# Patient Record
Sex: Female | Born: 1970 | Race: Black or African American | Hispanic: No | Marital: Single | Smoking: Never smoker
Health system: Southern US, Community
[De-identification: ages and names within clinical notes are randomized; demographics above are authoritative.]

## PROBLEM LIST (undated history)

## (undated) DIAGNOSIS — M51369 Other intervertebral disc degeneration, lumbar region without mention of lumbar back pain or lower extremity pain: Secondary | ICD-10-CM

## (undated) DIAGNOSIS — M545 Low back pain, unspecified: Secondary | ICD-10-CM

## (undated) DIAGNOSIS — F329 Major depressive disorder, single episode, unspecified: Secondary | ICD-10-CM

## (undated) DIAGNOSIS — E538 Deficiency of other specified B group vitamins: Secondary | ICD-10-CM

## (undated) DIAGNOSIS — G43909 Migraine, unspecified, not intractable, without status migrainosus: Secondary | ICD-10-CM

## (undated) DIAGNOSIS — M2550 Pain in unspecified joint: Secondary | ICD-10-CM

## (undated) DIAGNOSIS — M5136 Other intervertebral disc degeneration, lumbar region: Secondary | ICD-10-CM

## (undated) DIAGNOSIS — F419 Anxiety disorder, unspecified: Secondary | ICD-10-CM

## (undated) DIAGNOSIS — E559 Vitamin D deficiency, unspecified: Secondary | ICD-10-CM

## (undated) DIAGNOSIS — F32A Depression, unspecified: Secondary | ICD-10-CM

## (undated) DIAGNOSIS — I10 Essential (primary) hypertension: Secondary | ICD-10-CM

## (undated) DIAGNOSIS — J309 Allergic rhinitis, unspecified: Secondary | ICD-10-CM

## (undated) HISTORY — DX: Low back pain, unspecified: M54.50

## (undated) HISTORY — DX: Vitamin D deficiency, unspecified: E55.9

## (undated) HISTORY — DX: Depression, unspecified: F32.A

## (undated) HISTORY — DX: Deficiency of other specified B group vitamins: E53.8

## (undated) HISTORY — DX: Migraine, unspecified, not intractable, without status migrainosus: G43.909

## (undated) HISTORY — DX: Allergic rhinitis, unspecified: J30.9

## (undated) HISTORY — PX: ENDOMETRIAL ABLATION: SHX621

## (undated) HISTORY — DX: Morbid (severe) obesity due to excess calories: E66.01

## (undated) HISTORY — DX: Anxiety disorder, unspecified: F41.9

## (undated) HISTORY — DX: Pain in unspecified joint: M25.50

---
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
## 1996-01-19 HISTORY — PX: KNEE SURGERY: SHX244

## 1997-01-18 HISTORY — PX: TUBAL LIGATION: SHX77

## 2002-02-01 ENCOUNTER — Other Ambulatory Visit: Admission: RE | Admit: 2002-02-01 | Discharge: 2002-02-01 | Payer: Self-pay | Admitting: Family Medicine

## 2003-02-14 ENCOUNTER — Ambulatory Visit (HOSPITAL_COMMUNITY): Admission: RE | Admit: 2003-02-14 | Discharge: 2003-02-14 | Payer: Self-pay | Admitting: Internal Medicine

## 2003-02-19 ENCOUNTER — Encounter (INDEPENDENT_AMBULATORY_CARE_PROVIDER_SITE_OTHER): Payer: Self-pay | Admitting: Internal Medicine

## 2003-02-19 LAB — CONVERTED CEMR LAB

## 2003-04-03 ENCOUNTER — Encounter: Admission: RE | Admit: 2003-04-03 | Discharge: 2003-07-02 | Payer: Self-pay | Admitting: Internal Medicine

## 2003-06-24 ENCOUNTER — Ambulatory Visit (HOSPITAL_COMMUNITY): Admission: RE | Admit: 2003-06-24 | Discharge: 2003-06-24 | Payer: Self-pay | Admitting: Orthopedic Surgery

## 2003-09-19 ENCOUNTER — Ambulatory Visit: Payer: Self-pay | Admitting: Internal Medicine

## 2004-01-17 ENCOUNTER — Ambulatory Visit: Payer: Self-pay | Admitting: Internal Medicine

## 2004-03-09 ENCOUNTER — Ambulatory Visit: Payer: Self-pay | Admitting: Internal Medicine

## 2004-03-11 ENCOUNTER — Encounter: Admission: RE | Admit: 2004-03-11 | Discharge: 2004-03-11 | Payer: Self-pay | Admitting: Internal Medicine

## 2004-03-16 ENCOUNTER — Ambulatory Visit: Payer: Self-pay | Admitting: Internal Medicine

## 2004-04-06 ENCOUNTER — Ambulatory Visit: Payer: Self-pay | Admitting: Physical Medicine & Rehabilitation

## 2004-04-15 ENCOUNTER — Ambulatory Visit: Payer: Self-pay | Admitting: Internal Medicine

## 2004-05-09 ENCOUNTER — Emergency Department (HOSPITAL_COMMUNITY): Admission: EM | Admit: 2004-05-09 | Discharge: 2004-05-09 | Payer: Self-pay | Admitting: Emergency Medicine

## 2004-05-19 ENCOUNTER — Ambulatory Visit: Payer: Self-pay | Admitting: Physical Medicine & Rehabilitation

## 2004-06-26 ENCOUNTER — Encounter
Admission: RE | Admit: 2004-06-26 | Discharge: 2004-09-24 | Payer: Self-pay | Admitting: Physical Medicine & Rehabilitation

## 2004-06-29 ENCOUNTER — Emergency Department (HOSPITAL_COMMUNITY): Admission: EM | Admit: 2004-06-29 | Discharge: 2004-06-29 | Payer: Self-pay | Admitting: Emergency Medicine

## 2004-09-08 ENCOUNTER — Ambulatory Visit: Payer: Self-pay | Admitting: Physical Medicine & Rehabilitation

## 2004-10-15 ENCOUNTER — Encounter
Admission: RE | Admit: 2004-10-15 | Discharge: 2005-01-13 | Payer: Self-pay | Admitting: Physical Medicine & Rehabilitation

## 2004-11-12 ENCOUNTER — Ambulatory Visit: Payer: Self-pay | Admitting: Internal Medicine

## 2004-11-17 ENCOUNTER — Ambulatory Visit: Payer: Self-pay | Admitting: Physical Medicine & Rehabilitation

## 2004-12-08 ENCOUNTER — Encounter
Admission: RE | Admit: 2004-12-08 | Discharge: 2005-03-08 | Payer: Self-pay | Admitting: Physical Medicine & Rehabilitation

## 2004-12-21 ENCOUNTER — Ambulatory Visit: Payer: Self-pay | Admitting: Physical Medicine & Rehabilitation

## 2005-01-25 ENCOUNTER — Ambulatory Visit: Payer: Self-pay | Admitting: Internal Medicine

## 2005-02-16 ENCOUNTER — Ambulatory Visit: Payer: Self-pay | Admitting: Physical Medicine & Rehabilitation

## 2005-03-25 ENCOUNTER — Ambulatory Visit: Payer: Self-pay | Admitting: Internal Medicine

## 2005-06-08 ENCOUNTER — Encounter
Admission: RE | Admit: 2005-06-08 | Discharge: 2005-09-06 | Payer: Self-pay | Admitting: Physical Medicine & Rehabilitation

## 2005-06-08 ENCOUNTER — Ambulatory Visit: Payer: Self-pay | Admitting: Physical Medicine & Rehabilitation

## 2005-07-02 ENCOUNTER — Ambulatory Visit: Payer: Self-pay | Admitting: Physical Medicine & Rehabilitation

## 2005-08-16 ENCOUNTER — Emergency Department (HOSPITAL_COMMUNITY): Admission: EM | Admit: 2005-08-16 | Discharge: 2005-08-16 | Payer: Self-pay | Admitting: Family Medicine

## 2005-08-17 ENCOUNTER — Ambulatory Visit: Payer: Self-pay | Admitting: Internal Medicine

## 2005-10-26 ENCOUNTER — Ambulatory Visit: Payer: Self-pay | Admitting: Internal Medicine

## 2005-10-29 ENCOUNTER — Ambulatory Visit: Payer: Self-pay | Admitting: *Deleted

## 2005-12-31 ENCOUNTER — Ambulatory Visit: Payer: Self-pay | Admitting: Internal Medicine

## 2006-04-07 ENCOUNTER — Ambulatory Visit: Payer: Self-pay | Admitting: Internal Medicine

## 2006-06-16 ENCOUNTER — Ambulatory Visit: Payer: Self-pay | Admitting: Internal Medicine

## 2006-07-06 ENCOUNTER — Encounter (INDEPENDENT_AMBULATORY_CARE_PROVIDER_SITE_OTHER): Payer: Self-pay | Admitting: Internal Medicine

## 2006-07-06 DIAGNOSIS — IMO0002 Reserved for concepts with insufficient information to code with codable children: Secondary | ICD-10-CM | POA: Insufficient documentation

## 2006-07-06 DIAGNOSIS — I10 Essential (primary) hypertension: Secondary | ICD-10-CM | POA: Insufficient documentation

## 2006-07-06 DIAGNOSIS — M1990 Unspecified osteoarthritis, unspecified site: Secondary | ICD-10-CM | POA: Insufficient documentation

## 2006-07-06 DIAGNOSIS — M1710 Unilateral primary osteoarthritis, unspecified knee: Secondary | ICD-10-CM | POA: Insufficient documentation

## 2006-07-06 DIAGNOSIS — D6489 Other specified anemias: Secondary | ICD-10-CM | POA: Insufficient documentation

## 2006-09-14 ENCOUNTER — Ambulatory Visit: Payer: Self-pay | Admitting: Internal Medicine

## 2006-09-21 ENCOUNTER — Emergency Department (HOSPITAL_COMMUNITY): Admission: EM | Admit: 2006-09-21 | Discharge: 2006-09-21 | Payer: Self-pay | Admitting: Emergency Medicine

## 2006-10-05 ENCOUNTER — Encounter (INDEPENDENT_AMBULATORY_CARE_PROVIDER_SITE_OTHER): Payer: Self-pay | Admitting: *Deleted

## 2006-12-22 ENCOUNTER — Ambulatory Visit: Payer: Self-pay | Admitting: Internal Medicine

## 2007-01-25 ENCOUNTER — Ambulatory Visit: Payer: Self-pay | Admitting: Internal Medicine

## 2007-03-09 ENCOUNTER — Ambulatory Visit: Payer: Self-pay | Admitting: Internal Medicine

## 2007-03-09 LAB — CONVERTED CEMR LAB
ALT: 8 units/L (ref 0–35)
AST: 10 units/L (ref 0–37)
Albumin: 3.9 g/dL (ref 3.5–5.2)
Alkaline Phosphatase: 75 units/L (ref 39–117)
BUN: 8 mg/dL (ref 6–23)
Basophils Absolute: 0 10*3/uL (ref 0.0–0.1)
Basophils Relative: 0 % (ref 0–1)
CO2: 23 meq/L (ref 19–32)
Calcium: 8.9 mg/dL (ref 8.4–10.5)
Chloride: 105 meq/L (ref 96–112)
Cholesterol: 134 mg/dL (ref 0–200)
Creatinine, Ser: 0.71 mg/dL (ref 0.40–1.20)
Eosinophils Absolute: 0.2 10*3/uL (ref 0.0–0.7)
Eosinophils Relative: 2 % (ref 0–5)
Glucose, Bld: 88 mg/dL (ref 70–99)
HCT: 36.5 % (ref 36.0–46.0)
HDL: 49 mg/dL (ref >39)
Hemoglobin: 10.6 g/dL — ABNORMAL LOW (ref 12.0–15.0)
LDL Cholesterol: 67 mg/dL (ref 0–99)
Lymphocytes Relative: 23 % (ref 12–46)
Lymphs Abs: 2.2 10*3/uL (ref 0.7–4.0)
MCHC: 29 g/dL — ABNORMAL LOW (ref 30.0–36.0)
MCV: 68.7 fL — ABNORMAL LOW (ref 78.0–100.0)
Monocytes Absolute: 0.6 10*3/uL (ref 0.1–1.0)
Monocytes Relative: 7 % (ref 3–12)
Neutro Abs: 6.4 10*3/uL (ref 1.7–7.7)
Neutrophils Relative %: 68 % (ref 43–77)
Platelets: 239 10*3/uL (ref 150–400)
Potassium: 4.1 meq/L (ref 3.5–5.3)
RBC: 5.31 M/uL — ABNORMAL HIGH (ref 3.87–5.11)
RDW: 17.1 % — ABNORMAL HIGH (ref 11.5–15.5)
Sodium: 138 meq/L (ref 135–145)
Total Bilirubin: 0.7 mg/dL (ref 0.3–1.2)
Total CHOL/HDL Ratio: 2.7
Total Protein: 7.2 g/dL (ref 6.0–8.3)
Triglycerides: 88 mg/dL (ref <150)
VLDL: 18 mg/dL (ref 0–40)
WBC: 9.5 10*3/uL (ref 4.0–10.5)

## 2007-04-20 ENCOUNTER — Ambulatory Visit: Payer: Self-pay | Admitting: Internal Medicine

## 2007-06-29 ENCOUNTER — Ambulatory Visit: Payer: Self-pay | Admitting: Internal Medicine

## 2007-06-29 LAB — CONVERTED CEMR LAB
Basophils Absolute: 0 10*3/uL (ref 0.0–0.1)
Basophils Relative: 0 % (ref 0–1)
Eosinophils Absolute: 0.3 10*3/uL (ref 0.0–0.7)
Eosinophils Relative: 3 % (ref 0–5)
Ferritin: 8 ng/mL — ABNORMAL LOW (ref 10–291)
HCT: 34 % — ABNORMAL LOW (ref 36.0–46.0)
Hemoglobin: 9.9 g/dL — ABNORMAL LOW (ref 12.0–15.0)
Iron: 16 ug/dL — ABNORMAL LOW (ref 42–145)
Lymphocytes Relative: 23 % (ref 12–46)
Lymphs Abs: 2.6 10*3/uL (ref 0.7–4.0)
MCHC: 29.1 g/dL — ABNORMAL LOW (ref 30.0–36.0)
MCV: 63.9 fL — ABNORMAL LOW (ref 78.0–100.0)
Monocytes Absolute: 0.6 10*3/uL (ref 0.1–1.0)
Monocytes Relative: 5 % (ref 3–12)
Neutro Abs: 7.7 10*3/uL (ref 1.7–7.7)
Neutrophils Relative %: 69 % (ref 43–77)
Platelets: 265 10*3/uL (ref 150–400)
RBC: 5.32 M/uL — ABNORMAL HIGH (ref 3.87–5.11)
RDW: 16.6 % — ABNORMAL HIGH (ref 11.5–15.5)
Saturation Ratios: 4 % — ABNORMAL LOW (ref 20–55)
TIBC: 363 ug/dL (ref 250–470)
UIBC: 347 ug/dL
WBC: 11.2 10*3/uL — ABNORMAL HIGH (ref 4.0–10.5)

## 2007-07-12 ENCOUNTER — Emergency Department (HOSPITAL_COMMUNITY): Admission: EM | Admit: 2007-07-12 | Discharge: 2007-07-12 | Payer: Self-pay | Admitting: Family Medicine

## 2007-07-27 ENCOUNTER — Emergency Department (HOSPITAL_COMMUNITY): Admission: EM | Admit: 2007-07-27 | Discharge: 2007-07-27 | Payer: Self-pay | Admitting: Emergency Medicine

## 2007-09-23 ENCOUNTER — Emergency Department (HOSPITAL_COMMUNITY): Admission: EM | Admit: 2007-09-23 | Discharge: 2007-09-23 | Payer: Self-pay | Admitting: Emergency Medicine

## 2007-10-30 ENCOUNTER — Emergency Department (HOSPITAL_COMMUNITY): Admission: EM | Admit: 2007-10-30 | Discharge: 2007-10-30 | Payer: Self-pay | Admitting: Emergency Medicine

## 2008-04-17 ENCOUNTER — Emergency Department (HOSPITAL_COMMUNITY): Admission: EM | Admit: 2008-04-17 | Discharge: 2008-04-17 | Payer: Self-pay | Admitting: Emergency Medicine

## 2008-06-27 ENCOUNTER — Ambulatory Visit: Payer: Self-pay | Admitting: Internal Medicine

## 2008-07-10 ENCOUNTER — Ambulatory Visit: Payer: Self-pay | Admitting: Family Medicine

## 2008-07-11 ENCOUNTER — Encounter (INDEPENDENT_AMBULATORY_CARE_PROVIDER_SITE_OTHER): Payer: Self-pay | Admitting: Internal Medicine

## 2008-08-07 ENCOUNTER — Ambulatory Visit (HOSPITAL_COMMUNITY): Admission: RE | Admit: 2008-08-07 | Discharge: 2008-08-07 | Payer: Self-pay | Admitting: Internal Medicine

## 2008-10-23 ENCOUNTER — Telehealth (INDEPENDENT_AMBULATORY_CARE_PROVIDER_SITE_OTHER): Payer: Self-pay | Admitting: *Deleted

## 2008-10-23 ENCOUNTER — Ambulatory Visit: Payer: Self-pay | Admitting: Internal Medicine

## 2008-10-23 LAB — CONVERTED CEMR LAB
ALT: 9 units/L (ref 0–35)
AST: 13 units/L (ref 0–37)
Albumin: 3.8 g/dL (ref 3.5–5.2)
Alkaline Phosphatase: 59 units/L (ref 39–117)
BUN: 9 mg/dL (ref 6–23)
CO2: 24 meq/L (ref 19–32)
Calcium: 9.1 mg/dL (ref 8.4–10.5)
Chloride: 106 meq/L (ref 96–112)
Cholesterol: 133 mg/dL (ref 0–200)
Creatinine, Ser: 0.71 mg/dL (ref 0.40–1.20)
Glucose, Bld: 100 mg/dL — ABNORMAL HIGH (ref 70–99)
HDL: 49 mg/dL (ref >39)
LDL Cholesterol: 71 mg/dL (ref 0–99)
Potassium: 4.1 meq/L (ref 3.5–5.3)
Sodium: 141 meq/L (ref 135–145)
TSH: 1.856 microintl units/mL (ref 0.350–4.500)
Total Bilirubin: 0.5 mg/dL (ref 0.3–1.2)
Total CHOL/HDL Ratio: 2.7
Total Protein: 7.1 g/dL (ref 6.0–8.3)
Triglycerides: 65 mg/dL (ref <150)
VLDL: 13 mg/dL (ref 0–40)

## 2008-11-02 ENCOUNTER — Emergency Department (HOSPITAL_COMMUNITY): Admission: EM | Admit: 2008-11-02 | Discharge: 2008-11-02 | Payer: Self-pay | Admitting: Family Medicine

## 2009-03-03 ENCOUNTER — Emergency Department (HOSPITAL_COMMUNITY): Admission: EM | Admit: 2009-03-03 | Discharge: 2009-03-03 | Payer: Self-pay | Admitting: Family Medicine

## 2009-06-23 ENCOUNTER — Emergency Department (HOSPITAL_COMMUNITY): Admission: EM | Admit: 2009-06-23 | Discharge: 2009-06-23 | Payer: Self-pay | Admitting: Family Medicine

## 2009-10-09 ENCOUNTER — Emergency Department (HOSPITAL_COMMUNITY): Admission: EM | Admit: 2009-10-09 | Discharge: 2009-10-10 | Payer: Self-pay | Admitting: Emergency Medicine

## 2009-10-31 ENCOUNTER — Encounter: Admission: RE | Admit: 2009-10-31 | Discharge: 2009-10-31 | Payer: Self-pay | Admitting: Family Medicine

## 2009-12-08 ENCOUNTER — Ambulatory Visit: Payer: Self-pay | Admitting: Internal Medicine

## 2009-12-11 ENCOUNTER — Emergency Department (HOSPITAL_COMMUNITY)
Admission: EM | Admit: 2009-12-11 | Discharge: 2009-12-11 | Payer: Self-pay | Source: Home / Self Care | Admitting: Emergency Medicine

## 2009-12-15 LAB — CBC & DIFF AND RETIC
BASO%: 0.2 % (ref 0.0–2.0)
Basophils Absolute: 0 10*3/uL (ref 0.0–0.1)
EOS%: 3.3 % (ref 0.0–7.0)
Eosinophils Absolute: 0.3 10*3/uL (ref 0.0–0.5)
HCT: 31.6 % — ABNORMAL LOW (ref 34.8–46.6)
HGB: 9.7 g/dL — ABNORMAL LOW (ref 11.6–15.9)
Immature Retic Fract: 10.8 % — ABNORMAL HIGH (ref 0.00–10.70)
LYMPH%: 21.8 % (ref 14.0–49.7)
MCH: 19.3 pg — ABNORMAL LOW (ref 25.1–34.0)
MCHC: 30.7 g/dL — ABNORMAL LOW (ref 31.5–36.0)
MCV: 62.9 fL — ABNORMAL LOW (ref 79.5–101.0)
MONO#: 0.5 10*3/uL (ref 0.1–0.9)
MONO%: 5.2 % (ref 0.0–14.0)
NEUT#: 6.8 10*3/uL — ABNORMAL HIGH (ref 1.5–6.5)
NEUT%: 69.5 % (ref 38.4–76.8)
Platelets: 205 10*3/uL (ref 145–400)
RBC: 5.02 10*6/uL (ref 3.70–5.45)
RDW: 17.9 % — ABNORMAL HIGH (ref 11.2–14.5)
Retic %: 0.91 % (ref 0.50–1.50)
Retic Ct Abs: 45.68 10*3/uL (ref 18.30–72.70)
WBC: 9.8 10*3/uL (ref 3.9–10.3)
lymph#: 2.1 10*3/uL (ref 0.9–3.3)

## 2009-12-15 LAB — COMPREHENSIVE METABOLIC PANEL
ALT: <8 U/L (ref 0–35)
AST: 12 U/L (ref 0–37)
Albumin: 3.7 g/dL (ref 3.5–5.2)
Alkaline Phosphatase: 69 U/L (ref 39–117)
BUN: 7 mg/dL (ref 6–23)
CO2: 25 mEq/L (ref 19–32)
Calcium: 8.6 mg/dL (ref 8.4–10.5)
Chloride: 105 mEq/L (ref 96–112)
Creatinine, Ser: 0.67 mg/dL (ref 0.40–1.20)
Glucose, Bld: 100 mg/dL — ABNORMAL HIGH (ref 70–99)
Potassium: 4.2 mEq/L (ref 3.5–5.3)
Sodium: 139 mEq/L (ref 135–145)
Total Bilirubin: 0.5 mg/dL (ref 0.3–1.2)
Total Protein: 7.1 g/dL (ref 6.0–8.3)

## 2009-12-15 LAB — LACTATE DEHYDROGENASE: LDH: 142 U/L (ref 94–250)

## 2009-12-15 LAB — FOLATE: Folate: 9.1 ng/mL

## 2009-12-15 LAB — VITAMIN B12: Vitamin B-12: 227 pg/mL (ref 211–911)

## 2009-12-15 LAB — IRON AND TIBC
%SAT: 5 % — ABNORMAL LOW (ref 20–55)
Iron: 16 ug/dL — ABNORMAL LOW (ref 42–145)
TIBC: 316 ug/dL (ref 250–470)
UIBC: 300 ug/dL

## 2009-12-15 LAB — FERRITIN: Ferritin: 6 ng/mL — ABNORMAL LOW (ref 10–291)

## 2009-12-17 LAB — HEMOGLOBINOPATHY EVALUATION
Hemoglobin Other: 0 % (ref 0.0–0.0)
Hgb A2 Quant: 2.6 % (ref 2.2–3.2)
Hgb A: 63.5 % — ABNORMAL LOW (ref 96.8–97.8)
Hgb F Quant: 0 % (ref 0.0–2.0)
Hgb S Quant: 33.9 % — ABNORMAL HIGH (ref 0.0–0.0)

## 2009-12-30 ENCOUNTER — Ambulatory Visit (HOSPITAL_COMMUNITY)
Admission: RE | Admit: 2009-12-30 | Discharge: 2009-12-30 | Payer: Self-pay | Source: Home / Self Care | Attending: Specialist | Admitting: Specialist

## 2010-01-18 HISTORY — PX: FOOT FRACTURE SURGERY: SHX645

## 2010-02-06 ENCOUNTER — Ambulatory Visit: Payer: Self-pay | Admitting: Internal Medicine

## 2010-02-12 LAB — CBC WITH DIFFERENTIAL/PLATELET
BASO%: 0.3 % (ref 0.0–2.0)
Basophils Absolute: 0 10*3/uL (ref 0.0–0.1)
EOS%: 2.9 % (ref 0.0–7.0)
Eosinophils Absolute: 0.3 10*3/uL (ref 0.0–0.5)
HCT: 28.3 % — ABNORMAL LOW (ref 34.8–46.6)
HGB: 8.7 g/dL — ABNORMAL LOW (ref 11.6–15.9)
LYMPH%: 26 % (ref 14.0–49.7)
MCH: 19.2 pg — ABNORMAL LOW (ref 25.1–34.0)
MCHC: 30.7 g/dL — ABNORMAL LOW (ref 31.5–36.0)
MCV: 62.6 fL — ABNORMAL LOW (ref 79.5–101.0)
MONO#: 0.5 10*3/uL (ref 0.1–0.9)
MONO%: 5.4 % (ref 0.0–14.0)
NEUT#: 6.1 10*3/uL (ref 1.5–6.5)
NEUT%: 65.4 % (ref 38.4–76.8)
Platelets: 225 10*3/uL (ref 145–400)
RBC: 4.52 10*6/uL (ref 3.70–5.45)
RDW: 16.4 % — ABNORMAL HIGH (ref 11.2–14.5)
WBC: 9.4 10*3/uL (ref 3.9–10.3)
lymph#: 2.4 10*3/uL (ref 0.9–3.3)

## 2010-02-12 LAB — IRON AND TIBC
%SAT: 3 % — ABNORMAL LOW (ref 20–55)
Iron: 10 ug/dL — ABNORMAL LOW (ref 42–145)
TIBC: 326 ug/dL (ref 250–470)
UIBC: 316 ug/dL

## 2010-02-12 LAB — FERRITIN: Ferritin: 5 ng/mL — ABNORMAL LOW (ref 10–291)

## 2010-02-18 ENCOUNTER — Ambulatory Visit (HOSPITAL_BASED_OUTPATIENT_CLINIC_OR_DEPARTMENT_OTHER): Payer: Medicaid Other | Admitting: Physical Medicine & Rehabilitation

## 2010-02-18 ENCOUNTER — Ambulatory Visit: Payer: Medicaid Other | Attending: Physical Medicine & Rehabilitation

## 2010-02-18 ENCOUNTER — Ambulatory Visit: Admit: 2010-02-18 | Payer: Self-pay | Admitting: Physical Medicine & Rehabilitation

## 2010-02-18 DIAGNOSIS — M5380 Other specified dorsopathies, site unspecified: Secondary | ICD-10-CM

## 2010-02-18 DIAGNOSIS — E669 Obesity, unspecified: Secondary | ICD-10-CM

## 2010-02-18 DIAGNOSIS — M1710 Unilateral primary osteoarthritis, unspecified knee: Secondary | ICD-10-CM

## 2010-02-18 DIAGNOSIS — M47817 Spondylosis without myelopathy or radiculopathy, lumbosacral region: Secondary | ICD-10-CM | POA: Insufficient documentation

## 2010-02-18 DIAGNOSIS — G8929 Other chronic pain: Secondary | ICD-10-CM | POA: Insufficient documentation

## 2010-02-27 ENCOUNTER — Encounter: Payer: Medicaid Other | Admitting: Internal Medicine

## 2010-03-06 ENCOUNTER — Encounter (HOSPITAL_BASED_OUTPATIENT_CLINIC_OR_DEPARTMENT_OTHER): Payer: Medicaid Other | Admitting: Internal Medicine

## 2010-03-06 DIAGNOSIS — D508 Other iron deficiency anemias: Secondary | ICD-10-CM

## 2010-03-30 ENCOUNTER — Ambulatory Visit: Payer: Self-pay | Admitting: Physical Medicine & Rehabilitation

## 2010-03-30 ENCOUNTER — Encounter: Payer: Medicaid Other | Attending: Physical Medicine & Rehabilitation

## 2010-03-31 LAB — URIC ACID: Uric Acid, Serum: 5.7 mg/dL (ref 2.4–7.0)

## 2010-04-01 ENCOUNTER — Ambulatory Visit: Payer: Medicaid Other | Admitting: Physical Therapy

## 2010-04-02 LAB — URINALYSIS, ROUTINE W REFLEX MICROSCOPIC
Bilirubin Urine: NEGATIVE
Glucose, UA: NEGATIVE mg/dL
Hgb urine dipstick: NEGATIVE
Ketones, ur: NEGATIVE mg/dL
Nitrite: NEGATIVE
Protein, ur: NEGATIVE mg/dL
Specific Gravity, Urine: 1.016 (ref 1.005–1.030)
Urobilinogen, UA: 0.2 mg/dL (ref 0.0–1.0)
pH: 5.5 (ref 5.0–8.0)

## 2010-04-02 LAB — CBC
HCT: 25.9 % — ABNORMAL LOW (ref 36.0–46.0)
Hemoglobin: 7.3 g/dL — ABNORMAL LOW (ref 12.0–15.0)
MCH: 16.3 pg — ABNORMAL LOW (ref 26.0–34.0)
MCHC: 28.2 g/dL — ABNORMAL LOW (ref 30.0–36.0)
MCV: 57.8 fL — ABNORMAL LOW (ref 78.0–100.0)
Platelets: 217 10*3/uL (ref 150–400)
RBC: 4.48 MIL/uL (ref 3.87–5.11)
RDW: 17.4 % — ABNORMAL HIGH (ref 11.5–15.5)
WBC: 11.2 10*3/uL — ABNORMAL HIGH (ref 4.0–10.5)

## 2010-04-02 LAB — DIFFERENTIAL
Basophils Absolute: 0 10*3/uL (ref 0.0–0.1)
Basophils Relative: 0 % (ref 0–1)
Eosinophils Absolute: 0.2 10*3/uL (ref 0.0–0.7)
Eosinophils Relative: 2 % (ref 0–5)
Lymphocytes Relative: 22 % (ref 12–46)
Lymphs Abs: 2.5 10*3/uL (ref 0.7–4.0)
Monocytes Absolute: 0.7 10*3/uL (ref 0.1–1.0)
Monocytes Relative: 6 % (ref 3–12)
Neutro Abs: 7.8 10*3/uL — ABNORMAL HIGH (ref 1.7–7.7)
Neutrophils Relative %: 70 % (ref 43–77)

## 2010-04-02 LAB — HEPATIC FUNCTION PANEL
ALT: 10 U/L (ref 0–35)
AST: 16 U/L (ref 0–37)
Albumin: 3.2 g/dL — ABNORMAL LOW (ref 3.5–5.2)
Alkaline Phosphatase: 64 U/L (ref 39–117)
Bilirubin, Direct: 0.2 mg/dL (ref 0.0–0.3)
Indirect Bilirubin: 0 mg/dL — ABNORMAL LOW (ref 0.3–0.9)
Total Bilirubin: 0.2 mg/dL — ABNORMAL LOW (ref 0.3–1.2)
Total Protein: 7.1 g/dL (ref 6.0–8.3)

## 2010-04-02 LAB — BASIC METABOLIC PANEL
BUN: 9 mg/dL (ref 6–23)
CO2: 27 mEq/L (ref 19–32)
Calcium: 8.4 mg/dL (ref 8.4–10.5)
Chloride: 107 mEq/L (ref 96–112)
Creatinine, Ser: 0.73 mg/dL (ref 0.4–1.2)
GFR calc Af Amer: >60 mL/min (ref >60)
GFR calc non Af Amer: >60 mL/min (ref >60)
Glucose, Bld: 102 mg/dL — ABNORMAL HIGH (ref 70–99)
Potassium: 3.6 mEq/L (ref 3.5–5.1)
Sodium: 138 mEq/L (ref 135–145)

## 2010-04-02 LAB — LIPASE, BLOOD: Lipase: 28 U/L (ref 11–59)

## 2010-04-02 LAB — POCT CARDIAC MARKERS
CKMB, poc: <1 ng/mL — ABNORMAL LOW (ref 1.0–8.0)
CKMB, poc: <1 ng/mL — ABNORMAL LOW (ref 1.0–8.0)
Myoglobin, poc: 41 ng/mL (ref 12–200)
Myoglobin, poc: 57.9 ng/mL (ref 12–200)
Troponin i, poc: <0.05 ng/mL (ref 0.00–0.09)
Troponin i, poc: <0.05 ng/mL (ref 0.00–0.09)

## 2010-04-02 LAB — URINE MICROSCOPIC-ADD ON

## 2010-04-02 LAB — HEMOCCULT GUIAC POC 1CARD (OFFICE): Fecal Occult Bld: NEGATIVE

## 2010-04-02 LAB — POCT PREGNANCY, URINE: Preg Test, Ur: NEGATIVE

## 2010-04-06 LAB — POCT URINALYSIS DIP (DEVICE)
Bilirubin Urine: NEGATIVE
Glucose, UA: NEGATIVE mg/dL
Hgb urine dipstick: NEGATIVE
Nitrite: NEGATIVE
Protein, ur: NEGATIVE mg/dL
Specific Gravity, Urine: 1.02 (ref 1.005–1.030)
Urobilinogen, UA: 0.2 mg/dL (ref 0.0–1.0)
pH: 5.5 (ref 5.0–8.0)

## 2010-04-06 LAB — POCT PREGNANCY, URINE: Preg Test, Ur: NEGATIVE

## 2010-04-06 LAB — WET PREP, GENITAL
Clue Cells Wet Prep HPF POC: NONE SEEN
Trich, Wet Prep: NONE SEEN

## 2010-04-06 LAB — GC/CHLAMYDIA PROBE AMP, GENITAL
Chlamydia, DNA Probe: NEGATIVE
GC Probe Amp, Genital: NEGATIVE

## 2010-04-09 ENCOUNTER — Encounter (HOSPITAL_BASED_OUTPATIENT_CLINIC_OR_DEPARTMENT_OTHER): Payer: Medicaid Other | Admitting: Internal Medicine

## 2010-04-09 ENCOUNTER — Other Ambulatory Visit: Payer: Self-pay | Admitting: Internal Medicine

## 2010-04-09 DIAGNOSIS — R42 Dizziness and giddiness: Secondary | ICD-10-CM

## 2010-04-09 DIAGNOSIS — D649 Anemia, unspecified: Secondary | ICD-10-CM

## 2010-04-09 DIAGNOSIS — D508 Other iron deficiency anemias: Secondary | ICD-10-CM

## 2010-04-09 DIAGNOSIS — I10 Essential (primary) hypertension: Secondary | ICD-10-CM

## 2010-04-09 LAB — CBC WITH DIFFERENTIAL/PLATELET
BASO%: 0.3 % (ref 0.0–2.0)
Basophils Absolute: 0 10*3/uL (ref 0.0–0.1)
EOS%: 2.1 % (ref 0.0–7.0)
Eosinophils Absolute: 0.2 10*3/uL (ref 0.0–0.5)
HCT: 36.4 % (ref 34.8–46.6)
HGB: 11.8 g/dL (ref 11.6–15.9)
LYMPH%: 20.8 % (ref 14.0–49.7)
MCH: 22.6 pg — ABNORMAL LOW (ref 25.1–34.0)
MCHC: 32.4 g/dL (ref 31.5–36.0)
MCV: 69.7 fL — ABNORMAL LOW (ref 79.5–101.0)
MONO#: 0.7 10*3/uL (ref 0.1–0.9)
MONO%: 6.4 % (ref 0.0–14.0)
NEUT#: 7.5 10*3/uL — ABNORMAL HIGH (ref 1.5–6.5)
NEUT%: 70.4 % (ref 38.4–76.8)
Platelets: 186 10*3/uL (ref 145–400)
RBC: 5.22 10*6/uL (ref 3.70–5.45)
RDW: 22 % — ABNORMAL HIGH (ref 11.2–14.5)
WBC: 10.6 10*3/uL — ABNORMAL HIGH (ref 3.9–10.3)
lymph#: 2.2 10*3/uL (ref 0.9–3.3)
nRBC: 0 % (ref 0–0)

## 2010-04-09 LAB — IRON AND TIBC
%SAT: 17 % — ABNORMAL LOW (ref 20–55)
Iron: 47 ug/dL (ref 42–145)
TIBC: 269 ug/dL (ref 250–470)
UIBC: 222 ug/dL

## 2010-04-09 LAB — FERRITIN: Ferritin: 28 ng/mL (ref 10–291)

## 2010-04-15 ENCOUNTER — Encounter (HOSPITAL_BASED_OUTPATIENT_CLINIC_OR_DEPARTMENT_OTHER): Payer: Medicaid Other | Admitting: Internal Medicine

## 2010-04-15 DIAGNOSIS — R42 Dizziness and giddiness: Secondary | ICD-10-CM

## 2010-04-15 DIAGNOSIS — D508 Other iron deficiency anemias: Secondary | ICD-10-CM

## 2010-04-15 DIAGNOSIS — D649 Anemia, unspecified: Secondary | ICD-10-CM

## 2010-04-15 DIAGNOSIS — I10 Essential (primary) hypertension: Secondary | ICD-10-CM

## 2010-04-23 LAB — POCT URINALYSIS DIP (DEVICE)
Bilirubin Urine: NEGATIVE
Glucose, UA: NEGATIVE mg/dL
Hgb urine dipstick: NEGATIVE
Nitrite: NEGATIVE
Protein, ur: NEGATIVE mg/dL
Specific Gravity, Urine: 1.015 (ref 1.005–1.030)
Urobilinogen, UA: 0.2 mg/dL (ref 0.0–1.0)
pH: 5.5 (ref 5.0–8.0)

## 2010-04-30 LAB — POCT URINALYSIS DIP (DEVICE)
Bilirubin Urine: NEGATIVE
Glucose, UA: NEGATIVE mg/dL
Hgb urine dipstick: NEGATIVE
Ketones, ur: NEGATIVE mg/dL
Nitrite: NEGATIVE
Protein, ur: NEGATIVE mg/dL
Specific Gravity, Urine: 1.015 (ref 1.005–1.030)
Urobilinogen, UA: 0.2 mg/dL (ref 0.0–1.0)
pH: 5.5 (ref 5.0–8.0)

## 2010-04-30 LAB — POCT PREGNANCY, URINE: Preg Test, Ur: NEGATIVE

## 2010-05-04 ENCOUNTER — Encounter: Payer: Medicaid Other | Attending: Physical Medicine & Rehabilitation

## 2010-05-04 ENCOUNTER — Ambulatory Visit (HOSPITAL_BASED_OUTPATIENT_CLINIC_OR_DEPARTMENT_OTHER): Payer: Medicaid Other | Admitting: Physical Medicine & Rehabilitation

## 2010-05-04 DIAGNOSIS — M47817 Spondylosis without myelopathy or radiculopathy, lumbosacral region: Secondary | ICD-10-CM

## 2010-05-04 DIAGNOSIS — M545 Low back pain, unspecified: Secondary | ICD-10-CM | POA: Insufficient documentation

## 2010-05-05 NOTE — Procedures (Signed)
[name], [name] NO.:  [id number]  MEDICAL RECORD NO.:  [id number]           PATIENT TYPE:  O  LOCATION:  [facility]                          FACILITY:  [facility]  PHYSICIAN:  Erick Colace, M.D.DATE OF BIRTH:  03-Apr-1970  DATE OF PROCEDURE: DATE OF DISCHARGE:                              OPERATIVE REPORT  PROCEDURE:  Bilateral L2, L3, L4 medial branch blocks under fluoroscopic guidance.  INDICATION:  Lumbar pain, has had previous good relief after several weeks after the last set.  Pain is only partially response to medication management other conservative care.  Informed consent was obtained after describing risks and benefits of the procedure with patient.  These include bleeding, bruising, and infection.  She elects to proceed and has given written consent.  The patient placed prone on fluoroscopy table.  Betadine prep, sterile drape, a 25-gauge inch and half needle was used to anesthetize the skin and subcutaneous tissue 1% lidocaine x1.5 mL at each of 6 sites.  Then a 22-gauge 5-inch needle was inserted under fluoroscopic guidance, first charting left L5 SAP transverse process junction, bone contact made. Omnipaque 180 x 0.5 mL demonstrated no intravascular uptake.  Then 0.5 mL of solution containing 1 mg of 4 mg/mL dexamethasone and 3 mL of 2% MPF lidocaine.  Then the left L4 SAP transverse process junction, targeted bone contact made.  Omnipaque 180 x 0.5 mL demonstrated no intravascular uptake.  Then 0.5 mL of the dexamethasone lidocaine solution was injected.  Then the left L3 SAP transverse process junction, targeted bone contact made.  Omnipaque 180 x 0.5 mL demonstrated no intravascular uptake.  Then 0.5 mL of dexamethasone lidocaine solution was injected.  The same procedure was repeated on the right side using same needle injectate and technique.  The patient tolerated procedure well.  Pre and post injection vitals stable.  Post injection  instructions given.     Erick Colace, M.D. Electronically Signed    AEK/MEDQ  D:  05/04/2010 10:43:17  T:  05/05/2010 00:23:01  Job:  [id number]

## 2010-06-05 NOTE — Procedures (Signed)
NAMEGLADIES, SOFRANKO NO.:  [id number]   MEDICAL RECORD NO.:  [id number]          PATIENT TYPE:  REC   LOCATION:  [facility]                          FACILITY:  [facility]   PHYSICIAN:  Erick Colace, M.D.DATE OF BIRTH:  1970/04/11   DATE OF PROCEDURE:  07/05/2005  DATE OF DISCHARGE:                                 OPERATIVE REPORT   PROCEDURE PERFORMED:  Left L4 medial branch block.  Left L3 medial branch  block.  Left L2 medial branch block under fluoroscopic guidance and contrast  enhancement.   INDICATIONS FOR PROCEDURE:  Lumbar facet mediated pain, primarily left-sided  that had partial relief after injections done December 2006.   She did take 10 mg of Valium prior to the procedure.   Informed consent was obtained after describing risks and benefits of the  procedure to the patient.  These include bleeding, bruising, infection.  She  elects to proceed and has given written consent.   DESCRIPTION OF PROCEDURE:  Patient placed prone on fluoroscopy table.  Betadine prep and sterile draping applied.  25 gauge 1-1/2 inch needle was  used to anesthetize skin and subcutaneous tissue with 1% lidocaine. Then a  22 gauge 5 inch spinal needle was inserted under fluoroscopic guidance first  the junction of left L5 SAP transverse process bone contact made.  Omnipaque  180 x 0.5 mL life fluoroscopy demonstrated no intravascular uptake and 0.5  mL of a solution containing 0.5 mL of 40 mg per mL Depo-Medrol plus 1.5 mL  of 2% methylparaben free lidocaine. Then the left L4 SAP transverse process  junction targeted.  Bone contact made.  Omnipaque 180 x 0.5 mL live  fluoroscopy demonstrated no intravascular uptake.  0.5 mL of Depo-Medrol  lidocaine solution injected.  The left L3 SAP transverse process junction  targeted.  Bone contact made.  Omnipaque 180 x 0.5 mL demonstrated no  intravascular uptake and 0.5 mL of Depo-Medrol lidocaine solution injected  and the patient  tolerated the procedure well.  Post injection instructions  given.   PLAN:  Lumbar RF if after she sees Dr. Riley Kill if she has greater than 50%  pain relief even temporarily.      Erick Colace, M.D.  Electronically Signed     AEK/MEDQ  D:  07/05/2005 11:08:38  T:  07/06/2005 08:13:51  Job:  [id number]   cc:   [name], M.D.  Fax: [phone]

## 2010-06-05 NOTE — Op Note (Signed)
NAMENAKEYA, ADINOLFI NO.:  [id number]   MEDICAL RECORD NO.:  [id number]                   PATIENT TYPE:  OIB   LOCATION:  2899                                 FACILITY:  [facility]   PHYSICIAN:  Harvie Junior, M.D.                DATE OF BIRTH:  December 31, 1970   DATE OF PROCEDURE:  06/24/2003  DATE OF DISCHARGE:                                 OPERATIVE REPORT   PREOPERATIVE DIAGNOSES:  1. Chondromalacia of the patella.  2. Tight lateral retinaculum.   POSTOPERATIVE DIAGNOSES:  1. Chondromalacia of the patella.  2. Tight lateral retinaculum.  3. Anterior horn medial meniscal tear.   PRINCIPAL PROCEDURES:  1. Debridement of anterior horn medial meniscal tear.  2. Debridement of chondromalacia of patella.  3. Lateral retinacular release.   SURGEON:  Harvie Junior, M.D.   ASSISTANT:  Marshia Ly, P.A.   ANESTHESIA:  General.   BRIEF HISTORY:  A 40 year old female with a long history of having  significant anterior knee pain bilaterally.  She was ultimately followed by  Dr. Althea Charon in our office who ultimately felt that the patient was having  anterior knee pain which was recalcitrant to all conservative care.  Because  of continued complaints of pain, the patient was ultimately evaluated and  felt to have significant chondromalacia of the patella as well as anterior  knee pain.  We talked about treatment options including continued anti-  inflammatory medication and continued physical therapy and because of the  patient's desire not to continue with conservative care and given her  failure of conservative care to date, it was ultimately decided to take her  to the operating room for operative knee arthroscopy.   PROCEDURE:  The patient was taken to the operating room and adequate  anesthesia was obtained with general anesthetic.  With the patient supine on  the operating table, the right leg was prepped and draped in usual sterile  fashion.   Following this, routine arthroscopic evaluation of the knee  revealed that there was quite dramatic lateral patellar tilt.  Chondromalacia of the patella was grade 3.  This was debrided.  Attention  was turned down to the medial compartment where there was a small anterior  horn medial meniscal tear which was debrided.  The posterior horn looked  well.  The medial femoral condyle looked quite well.  Attention was then  turned to the lateral compartment where there was no significant  chondromalacia and no significant changes in the lateral meniscus.  The  patellofemoral joint was then returned to the dramatic lateral patellar tilt  and once again identified.  The camera was switched to the alternate  compartment and a lateral retinacular release was performed from proximal to  the patella distally down to the portal.  A shaver was used to complete  that.  At this point, the patellar tracking was noted to be  central and the wound was then copiously irrigated and suctioned dry.  The  portals were then closed with a bandage and dry compression dressing was  applied and the patient was taken to recovery where she was noted to be in  satisfactory condition.  Estimated blood loss for the procedure was  ___________.                                               Harvie Junior, M.D.    Ranae Plumber  D:  06/24/2003  T:  06/24/2003  Job:  [id number]

## 2010-06-05 NOTE — Procedures (Signed)
[name], [name]              ACCOUNT NO.:  [id number]   MEDICAL RECORD NO.:  [id number]          PATIENT TYPE:  REC   LOCATION:  [facility]                          FACILITY:  [facility]   PHYSICIAN:  Erick Colace, M.D.DATE OF BIRTH:  1970-02-28   DATE OF PROCEDURE:  12/21/2004  DATE OF DISCHARGE:                                 OPERATIVE REPORT   PROCEDURE:  Left L4-5 facet denervation, medial branch block of L4 medial  branch and left L3 medial branch.   INDICATIONS FOR PROCEDURE:  Lumbar facet arthropathy.  Only partially  responsive to narcotic analgesics.   Informed consent was obtained after describing the risks and benefits of the  procedure with the patient.  These include bleeding, bruising, infection,  loss of bowel or bladder function.  Patient elects to proceed and has given  written consent.   Patient placed prone on fluoroscopy table.  Betadine prep and sterile drape.  A 25-gauge inch and a half needle was used to anesthetize skin and  subcutaneous tissue with 1% lidocaine x3 mL at two separate sites.  A 5 inch  22-gauge spinal needle was inserted under fluoroscopic guidance to target  the left L5 SAP transverse process junction.  Bone contact made.  Omnipaque  180 under live fluoroscopy demonstrated no intravascular uptake and 0.5 mL  of solution containing 0.5 mL 40 mg/mL Depo-Medrol plus 2.5 mL of 2%  methylparaben-free lidocaine were injected.  Then the L4 SAP transverse  process junction was targeted and bone contact made.  Omnipaque 180 under  live fluoroscopy demonstrated no intravascular uptake, then 0.5 mL of Depo-  Medrol lidocaine solution were injected.  Next, the skin and subcu of  overlying left L3 were infiltrated with lidocaine 1% x3 mL.  Needle  manipulated toward L3 SAP transverse process junction, however, patient  could not remain still despite some additional lidocaine.  Therefore, it was  elected to stop the procedure.  She will follow up with  Dr. Riley Kill if she  decides to repeat the procedure, would use 10 mg of valium prior to.      Erick Colace, M.D.  Electronically Signed     AEK/MEDQ  D:  12/21/2004 14:48:08  T:  12/22/2004 09:39:27  Job:  [id number]   cc:   [name], M.D.  Fax: [phone]

## 2010-06-05 NOTE — Assessment & Plan Note (Signed)
DATE OF VISIT:  May 19, 2004.   MEDICAL RECORD NUMBER:  [id number].   [name] is here regarding her low back pain.  She had some minor effects  with the Celebrex, although not appreciable.  She felt the Rozerem helped  her sleep, but did not take it every day and only used it as needed.  She  has had some more pain in the low back, particularly with some of the recent  cool, damp weather.  She only got to one therapy visit and has had problems  getting back due to transportation.  She hopes to rectify that later this  month.  The patient reports her pain at a 9/10.  She had an ER visit on  May 10, 2004, due to increased low back pain.  She was given Darvocet with  modest results.  We called in Ultracet, which did not relieve the pain.  The  patient describes the pain as dull and stabbing in the low back without  radiation to the legs.  She also has knee pain.  The pain is worse with  walking, sitting, standing and general activities and improves with heat,  ice and her medications.  The pain definitely interferes with her  relationships with others.  The patient is able to walk 50 minutes or less  without having to stop.  She is able to climb a few steps.  She drives on a  limited basis.   REVIEW OF SYSTEMS:  The patient reports depression and decreased appetite.  The full review of systems is in the health and history section of the  chart.   SOCIAL HISTORY:  She has four children.  She is single.  She is hoping to  have a ride from a friend of her's who can bring her to therapy next month.   PHYSICAL EXAMINATION:  VITAL SIGNS:  The blood pressure is 154/72, the pulse  is 93 and respiratory rate 20.  She is saturating 98% on room air.  GENERAL APPEARANCE:  The patient is pleasant and in no acute distress.  She  remains morbidly obese.  She is alert and oriented x 3.  Affect is  appropriate.  NEUROLOGIC:  She walks with a wide based gait.  She has some problems  transferring from a  sit to stand position.  Coordination is fair.  Reflexes  are 2+.  Sensation is intact.  Straight leg testing is negative, except for  some hamstring tightness.  Piriformis testing and Patrick's testing were  negative.  The patient had positive facet maneuvers to the right more so  than the left.  Bilateral facet areas and paraspinals were tender upon  palpation today.  The patient was limited somewhat with forward flexion to  about 40 degrees before she had tightness develop in the low back area.  SKIN:  Generally intact.  EXTREMITIES:  Pulses were 2+.  HEART:  Regular rate and rhythm.  LUNGS:  Clear.   ASSESSMENT:  1.  Chronic low back pain consistent most likely with a facet arthropathy.      Cannot rule out SI joint dysfunction.  2.  Morbid obesity.  3.  Right patellofemoral pain syndrome.  4.  Hypertension.   PLAN:  1.  Will refocus on physical therapy.  She needs to get started on a home      exercise program as well.  Encouraged some exercise as tolerated at the      house, as well as dietary improvement.  2.  Consider facet injections at some point depending on therapy response.  3.  Continue Celebrex 200 mg daily.  4.  I want her taking Rozerem 8 mg schedule for sleep.  5.  Will increase her oxycodone to 7.5/325 mg q.6h. p.r.n. and #90 were      dispensed today.  6.  I gave her samples and a prescription for Lidoderm patches to place over      the low back area.  7.  I will see her back in about six weeks' time.      ZTS/MedQ  D:  05/19/2004 14:09:47  T:  05/19/2004 16:02:19  Job #:  [id number]   cc:   Tresa Endo L. Philipp Deputy, M.D.  [phone] [address]  [place] [address]  Fax: [phone]

## 2010-06-05 NOTE — Assessment & Plan Note (Signed)
[name] is back regarding her low back pain.  I have not seen her since  May.  She has been working on her diet but has not lost weight.  She is  having problems with exercise tolerance due to low back and right knee pain.  She is using Percocet for breakthrough pain at the 7.5/325 mg dose.  She  states that she has been eating healthier food but is frustrated that she  cannot lose weight.  She remains active with the kids.  She is working part-  time doing day care.  She does stay fairly active.  The pain is a 6/10 on  average and described as sharp, burning, aching.  Sleep is fair.  She  stopped her Rozerem as it was not helping and went to Ambien, which seems to  work better.   REVIEW OF SYSTEMS:  The patient reports some depression.  Denies any  neurological problems, constitutional, GU, GI or cardiorespiratory  complaints today.   SOCIAL HISTORY:  She is single with four children.  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   PHYSICAL EXAMINATION:  VITAL SIGNS:  Blood pressure is 121/76, pulse is 84,  respiratory rate 16.  She is saturating 97% on room air.  GENERAL:  She is pleasant, in no acute distress.  She is morbidly obese.  She is alert and oriented x3.  Affect is bright and appropriate.  NEUROLOGIC/MUSCULOSKELETAL:  Gait is slightly wide-based and shuffling.  The  patient has pain with flexion and extension today and slightly more with  extension and facet maneuvers.  She is difficult to palpate due to her  obesity.  Muscle strength of the lower extremities is 5/5, and sensation is  intact.  Piriformis testing, Patrick's testing and straight leg maneuvers  were negative.  SKIN:  Generally intact.  EXTREMITIES:  No clubbing, cyanosis, or edema.  CARDIAC:  Heart was regular rate and rhythm.  CHEST:  The lungs were clear.   ASSESSMENT:  1.  Chronic low back pain due to facet arthropathy and morbid obesity.  2.  Right patellofemoral pain syndrome.  3.   Hypertension.   PLAN:  1.  We discussed options today regarding potential injections.  The patient      has a phobia of needles and does not want to go through with injection,      at least at this point.  Ultimately she needs to work on exercise and      physical fitness.  We discussed core muscle strengthening today as well      as diet and aerobic activity.  2.  To help move her along with her activity, will begin her on Avinza 30 mg      daily.  Number 30 were dispensed.  She may continue with oxycodone      7.5/325 mg one q.6h. for breakthrough pain.  3.  Celebrex 200 mg p.o. daily is to continue.  4.  Will see her back in about one month's time.      [name], M.D.  Electronically Signed     ZTS/MedQ  D:  09/09/2004 12:56:22  T:  09/09/2004 15:47:19  Job #:  [id number]   cc:   [name], M.D.  [phone] [address]  [place] [address]  Fax: [phone]

## 2010-06-05 NOTE — Assessment & Plan Note (Signed)
[name] is back regarding her low back pain.  She had part of the L4-L5, L3-  L4 facet injections on December 21, 2004, by Dr. Wynn Banker.  She had  problems with pain with manipulation at the L3 level and it was elected to  stop the procedure.  Despite this, patient reported significant relief of  her pain and pain was down to a 1/10 for two or three days after the  procedure.  Otherwise, over the last few weeks, pain has been at a 6/10.  She has not been taking her fentanyl patch consistently as she had  significant infection and was ill for a week or two in early January.  She  finds that the fentanyl is helpful though.  She uses Percocet 7.5 for  breakthrough pain.  She rates her pain at a 6 to 8/10 currently.  Pain is  sharp, stabbing and interferes with general activity, relationships with  others, and enjoyment of live on a moderate to sever level.  Pain increases  with walking and bending.  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   REVIEW OF SYSTEMS:  Patient reports spasms, trouble walking.  Other  pertinent positives listed above and full review is in the health and  history section of the chart.   SOCIAL HISTORY:  Without change.  Pertinent issues are listed above.   PHYSICAL EXAMINATION:  GENERAL APPEARANCE:  Patient is pleasant, in no acute  distress.  VITAL SIGNS:  Blood pressure 133/50, pulse 54, respiratory rate 16.  She is  sating 100% on room air.  NEUROLOGIC:  Coordination is fair.  Reflexes are 2+.  Sensation is normal.  Motor function is 5/5.  She continues to have pain in the lumbar paraspinals  and facets at L3-L4 and L4-L5 levels predominantly.  Still has mild  tenderness in L5-S1 on the left.  Facet maneuvers are positive.  Patient had  some discomfort with flexion and generally more with extension and rotation  today.  Side bending did cause some discomfort.  She remains morbidly obese.  Examination is difficult due to adipose tissue.   ASSESSMENT:  1.  Chronic low back pain due to facet arthropathy and morbid obesity.  2.  Right patellofemoral pain syndrome.  3.  Hypertension.   PLAN:  1.  Will send the patient back to Dr. Wynn Banker for repeat facet blocks at      L3-L4 and L4-L5 on the left.  She is given 10 mg of Valium to take prior      to the procedure.  2.  I refilled fentanyl 50 mcg q.72h. which should be due for a refill in      mid March.  I also refilled Percocet 7.5/325 one q.6h. p.r.n. #90.  3.  Patient ultimately may benefit from an RF procedure to the low back.  If      we go this route, as stated I would expect the patient to undertake an      aggressive diet and exercise program after the procedure.  4.  Patient may continue Celebrex for now as an anti-inflammatory.  5.  I will see the patient back after her injection series with Dr.      Wynn Banker.      [name], M.D.  Electronically Signed     ZTS/MedQ  D:  02/17/2005 10:58:52  T:  02/17/2005 13:04:28  Job #:  [id number]   cc:   Tresa Endo L. Philipp Deputy, M.D.  Fax: [phone]

## 2010-06-05 NOTE — Assessment & Plan Note (Signed)
FOLLOW-UP OFFICE NOTE   [name] is back regarding her chronic low back pain.  The patient was  scheduled for follow-up facet blocks on the left by Dr. Wynn Banker, but she  became anxious about doing these and canceled the injections.  She did  report that the injections that she had done seemed to help some of her  pain, even though they were not completely finished.  Currently, she is  using Percocet q.6h. p.r.n. for breakthrough pain, as well as Ultram ER 200  mg daily.  She feels that this combination helps somewhat, although the  Percocet makes her tired.  She had to stop the Fentanyl due to nausea and  skin rash.  The patient rates her pain as a 5 out of 10 today and describes  it as sharp, stabbing, aching, and burning.  The pain is most prominent in  the low back region, left greater than right.   The pain generally is worse with walking, bending, and activities.  It seems  to be worse also in the morning and the evening hours.  The pain improves  somewhat with medications, as well as heat and ice.  The patient can walk  about 15 minutes without having to stop.  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   REVIEW OF SYSTEMS:  Positive for numbness, trouble walking.  She denies any  constitutional, GU, GI, or cardiorespiratory complaints today.   SOCIAL HISTORY:  Pertinent positives are listed above.   PHYSICAL EXAMINATION:  VITAL SIGNS:  Blood pressure is 117/66, pulse is 84,  respiratory rate is 16, she is satting 98% on room air.  GENERAL:  The patient is pleasant and in no acute distress.  She remains  morbidly obese.  MUSCULOSKELETAL:  She walks in a semi-flexed posture at the hips.  She has a  valgus deformity at the right knee that increases with stance.  The low back  is somewhat tender to palpation over the lumbar facets and paraspinals.  She  has fair flexion today at 60-70 degrees.  Extension is 15-20 degrees with  some pain.   Also has pain with left bending and right bending today.  Less  comfort is noted with twisting.  Motor function is 5/5.  Sensory exam is  intact.  NEUROLOGIC:  Cognitively, the patient is appropriate.  Cranial nerve exam is  intact.  HEART:  Regular rate and rhythm.  LUNGS:  Clear.  ABDOMEN:  Soft, nontender.   ASSESSMENT:  1.  Chronic low back pain due to facet arthropathy and morbid obesity.  2.  Right patellofemoral pain syndrome.  3.  Hypertension.   PLAN:  1.  We will send the patient to Dr. Wynn Banker to attempt left-sided facet      blocks at L3-L4 and L4-L5.  Consider right-sided blocks same day or in      the future as the patient tolerates.  She will try Valium before the      procedure to help relax her.  2.  We will increase Ultram ER to 300 mg daily.  Continue Percocet 7.5/325      for breakthrough pain, 1 q.6h. p.r.n., #90.  3.  Discussed the expected effects of procedure and the course afterwards.      She will need to really focus on      exercise, core strengthening and weight loss.  4.  I will see the patient back after she completes  her course with Dr.      Wynn Banker.      [name], M.D.  Electronically Signed     ZTS/MedQ  D:  06/09/2005 12:05:03  T:  06/09/2005 16:27:00  Job #:  [id number]   cc:   Tresa Endo L. Philipp Deputy, M.D.  Fax: [phone]

## 2010-06-05 NOTE — Assessment & Plan Note (Signed)
MEDICAL RECORD NUMBER:  [id number]   [name] is back regarding her low back pain. She tried the Avinza at home  and was unable to tolerate this due to nausea and significant fatigue. She  does a little better tolerating the Percocet. We had mentioned back  injections last time but the patient seemed hesitant as she is afraid of  needles. She states that her pain is an 8/10 today. It usually worsens with  sitting and some activities. It interferes with general activity and  relations with others on a moderate level. Sleep is fair. The patient also  complains of right knee pain today.   REVIEW OF SYSTEMS:  The patient reports numbness, trouble walking, but  denies constitutional, GU, GI, or cardiorespiratory complaints today.   SOCIAL HISTORY:  The patient remains single with her four kids and works as  an Manufacturing systems engineer.   PHYSICAL EXAMINATION:  Blood pressure is 143/45, pulse is 80, respiratory  rate 16, she is saturating 99% in room air. The patient remains morbidly  obese. She is pleasant and appropriate. Affect is fair. Gait is wide based  and she is somewhat antalgic to the right side today. Coordination is fair.  Reflexes are 1+. Sensation is grossly intact. Motor exam is generally 5/5  throughout. The patient had pain over the lumbar paraspinals and facets at  L3-L4 and L4-L5 most predominantly. There may have been some mild tenderness  at L5-S1. She is less tender at the PSIS areas. Straight leg testing was  equivocal. She had pain with extension and facet maneuvers. Exam is somewhat  difficult to appreciate due to her body habitus.   ASSESSMENT:  1.  Chronic low back pain due to facet arthropathy and morbid obesity.  2.  Right patellofemoral pain syndrome.  3.  Hypertension.   PLAN:  1.  The patient decided to go ahead and go through the back injections. We      will set her up with Dr. Wynn Banker for bilateral medial branch blocks at      L3-L4 and L4-L5  levels. Could consider L5-S1 although I think her pain      seems to be more associated with the first two levels.  2.  We will start a trial of Ultram extended-release 100 mg, increasing to      200 mg daily after trial package complete.  3.  Refilled Percocet 7.5/325 for breakthrough pain q.6h. p.r.n., #90.  4.  Discussed appropriate diet and exercise. The patient has joined an      aerobics class.  5.  The patient can continue her Celebrex 200 mg p.o. daily until a week      prior to her injection.  6.  I will see her back pending injections by Dr. Wynn Banker.      [name], M.D.  Electronically Signed     ZTS/MedQ  D:  11/18/2004 11:51:19  T:  11/18/2004 13:54:44  Job #:  [id number]   cc:   Tresa Endo L. Philipp Deputy, M.D.  Fax: [phone]

## 2010-06-05 NOTE — Group Therapy Note (Signed)
CHIEF COMPLAINT:  Low back pain.   HISTORY OF PRESENT ILLNESS:  This is a 40 year old black female with a two  year history of low back pain which developed insidiously.  She does not  recall any accident or causes of event.  She has been followed off and on  over this time for pain management, and really has not had success despite  multiple medications including antiinflammatories, muscle relaxants and  narcotics.  She is going for her first round of physical therapy this week.  She is taking Oxycodone currently for breakthrough pain, but does not like  to take this medication because it makes her feel nauseous.  She had an MRI  done on March 11, 2004, which revealed facet arthropathy at multiple  levels including L3-4, L4-5, L5-S1.  There is no nerve root impingement or  myelopathy noted.   The patient notes that pain is worse when she walks and particularly when  she goes up steps.  With any type of position, she tends to get tight after  a while.  The pain seems to be worse at nighttime.  Her sleep is poor.  She  has tried Ambien in the past but does not like the medication.  She has had  a benefit with heat and ice in the past, but this is only temporary, lasting  for about an hour.  Oxycodone does help her pain to a certain extent.  The  patient states that her pain has interfered with her life on multiple levels  including general activity, relationships with others and enjoyment of life.  The patient describes her pain as sharp, dull, stabbing and aching.  The  pain is generally worse in the morning until she is warmed up.  The  patient states that she can walk about 30 minutes or less without  assistance.  She is only able to go up and down steps one time daily.  The  knee also affects her gait and step climbing.  She still drives.   PAST MEDICAL HISTORY:  1.  Hypertension.  2.  Obesity.  3.  Tubal ligation in 1999.  4.  Right arthroscopic knee surgery in May 2005 for  patella femoral      syndrome.   CURRENT MEDICATIONS:  1.  Oxycodone 5/325 one q.6h. p.r.n.  2.  Atenolol 100 mg daily.  3.  Hydrochlorothiazide 25 mg daily.   ALLERGIES:  None.   REVIEW OF SYSTEMS:  The patient reports weakness, trouble walking, depressed  mood, decreased sleep.  Denies fever or chills, weight loss, rash, nausea,  vomiting, reflux, gastritis, urine retention, wheezing, coughing, shortness  of breath.   SOCIAL HISTORY:  The patient is single with four children.  Her last child  was born in 67.  She last worked in October 2005.  The patient needs  assistance with meal prep, shopping, household activities.   FAMILY HISTORY:  Negative for diabetes and high blood pressure.   PHYSICAL EXAMINATION:  GENERAL APPEARANCE:  The patient is pleasant, in no  acute distress.  The patient is morbidly obese, walks with wide based gait.  Knees revealed valgus deformities bilaterally.  She is alert and oriented  x2.  Affect is bright and appropriate.   VITAL SIGNS:  Blood pressure stable, afebrile.  HEART:  Regular rate and rhythm.  LUNGS:  Clear.  ABDOMEN:  Soft and nontender.  NEUROLOGICAL:  Upper extremity strength was 5/5 with normal reflexes, range  of motion and sensory function.  Lower extremity strength was 4+-5/5  depending on muscle tested.  There was no specific pattern today.  There was  occasional pain with exertion of the proximal musculature of the legs.  Sensory exam was normal.  Reflexes were 1+ in the legs.  She had intact  pulses.  Edema was 1++.  The patient had a positive seated slump test today.  Straight-leg testing was equivocal.  Facet testing was positive.  Patrick's  test was positive.  Compression test was positive.  The patient had about 45  degrees of forward flexion noted.  The lumbar extension was 15 degrees with  significant pain.  Lateral bending caused discomfort to either side at about  20 degrees.  Rotation caused less pain today but was  20-25 degrees.  Cognitively, the patient was appropriate.   ASSESSMENT:  1.  Chronic low back pain most consistent with facet arthropathy, cannot      rule out SI joint dysfunction.  2.  Morbid obesity.  3.  Right patella femoral pain syndrome.  4.  Hypertension.   PLAN:  1.  The patient has not had physical therapy up to this point.  I believe      that is the way to go at this time.  She needs to work on low back and      abdominal and pelvic range of motion and strengthening.  She needs to      get herself on a regular exercise and stretching program.  Could      consider a TENS unit for a localized pain relief in the short term.  2.  Consider facet injections depending on response to therapy.  3.  Add low-dose NSAID, i.e., Celebrex 200 mg daily for now.  4.  Will try Rozerem 8 mg q.h.s. for sleep.  5.  The patient may stay with Oxycodone IR 5 mg/325 mg q.6-8h. p.r.n.  6.  I will see the patient back in about 6 weeks time.      ZTS/MedQ  D:  04/06/2004 10:03:44  T:  04/06/2004 10:48:17  Job #:  [id number]   cc:   Tresa Endo L. Philipp Deputy, M.D.  [phone] [address]  [place] [address]  Fax: [phone]

## 2010-06-09 ENCOUNTER — Other Ambulatory Visit: Payer: Self-pay | Admitting: Internal Medicine

## 2010-06-09 ENCOUNTER — Encounter: Payer: Medicaid Other | Attending: Physical Medicine & Rehabilitation | Admitting: Physical Medicine & Rehabilitation

## 2010-06-09 ENCOUNTER — Encounter: Payer: Medicaid Other | Admitting: Internal Medicine

## 2010-06-09 DIAGNOSIS — G8929 Other chronic pain: Secondary | ICD-10-CM | POA: Insufficient documentation

## 2010-06-09 DIAGNOSIS — M129 Arthropathy, unspecified: Secondary | ICD-10-CM | POA: Insufficient documentation

## 2010-06-09 DIAGNOSIS — M5380 Other specified dorsopathies, site unspecified: Secondary | ICD-10-CM

## 2010-06-09 DIAGNOSIS — M545 Low back pain, unspecified: Secondary | ICD-10-CM | POA: Insufficient documentation

## 2010-06-09 DIAGNOSIS — E669 Obesity, unspecified: Secondary | ICD-10-CM

## 2010-06-09 DIAGNOSIS — M47817 Spondylosis without myelopathy or radiculopathy, lumbosacral region: Secondary | ICD-10-CM

## 2010-06-09 LAB — FERRITIN: Ferritin: 23 ng/mL (ref 10–291)

## 2010-06-09 LAB — CBC WITH DIFFERENTIAL/PLATELET
BASO%: 0.1 % (ref 0.0–2.0)
Basophils Absolute: 0 10*3/uL (ref 0.0–0.1)
EOS%: 2.2 % (ref 0.0–7.0)
Eosinophils Absolute: 0.2 10*3/uL (ref 0.0–0.5)
HCT: 35.7 % (ref 34.8–46.6)
HGB: 11.9 g/dL (ref 11.6–15.9)
LYMPH%: 23.9 % (ref 14.0–49.7)
MCH: 24.9 pg — ABNORMAL LOW (ref 25.1–34.0)
MCHC: 33.3 g/dL (ref 31.5–36.0)
MCV: 74.7 fL — ABNORMAL LOW (ref 79.5–101.0)
MONO#: 0.4 10*3/uL (ref 0.1–0.9)
MONO%: 4.9 % (ref 0.0–14.0)
NEUT#: 6.2 10*3/uL (ref 1.5–6.5)
NEUT%: 68.9 % (ref 38.4–76.8)
Platelets: 176 10*3/uL (ref 145–400)
RBC: 4.78 10*6/uL (ref 3.70–5.45)
RDW: 14.2 % (ref 11.2–14.5)
WBC: 9 10*3/uL (ref 3.9–10.3)
lymph#: 2.2 10*3/uL (ref 0.9–3.3)
nRBC: 0 % (ref 0–0)

## 2010-06-09 LAB — IRON AND TIBC
%SAT: 11 % — ABNORMAL LOW (ref 20–55)
Iron: 32 ug/dL — ABNORMAL LOW (ref 42–145)
TIBC: 279 ug/dL (ref 250–470)
UIBC: 247 ug/dL

## 2010-06-09 LAB — COMPREHENSIVE METABOLIC PANEL WITH GFR
ALT: 10 U/L (ref 0–35)
AST: 16 U/L (ref 0–37)
Albumin: 3.6 g/dL (ref 3.5–5.2)
Alkaline Phosphatase: 65 U/L (ref 39–117)
BUN: 8 mg/dL (ref 6–23)
CO2: 25 meq/L (ref 19–32)
Calcium: 8.4 mg/dL (ref 8.4–10.5)
Chloride: 103 meq/L (ref 96–112)
Creatinine, Ser: 0.6 mg/dL (ref 0.40–1.20)
Glucose, Bld: 92 mg/dL (ref 70–99)
Potassium: 3.5 meq/L (ref 3.5–5.3)
Sodium: 139 meq/L (ref 135–145)
Total Bilirubin: 0.7 mg/dL (ref 0.3–1.2)
Total Protein: 6.5 g/dL (ref 6.0–8.3)

## 2010-06-10 NOTE — Assessment & Plan Note (Signed)
[name] is back regarding her low back pain.  She felt some numbness from the medial branch blocks for a couple of weeks to low back, but no other substantial result.  The patient rates her pain as 6-7/10.  She had some hydrocodone for pain.  She reports poor sleep.  Pain is worse when she sits or stands for long periods of time and also when she walks.  Pain does not leave the back and upper buttock regions.  She is working on an exercise program, but really has not got extremely forward with that.  REVIEW OF SYSTEMS:  Notable for the above.  Full 12-point review is in the written health and history section of the chart.  SOCIAL HISTORY:  Unchanged.  She does report that she is single, living with her 2 kids.  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  PHYSICAL EXAMINATION:  VITAL SIGNS:  Blood pressure 144/77, pulse 84, respiratory rate 18, she is satting 92% on room air. MUSCULOSKELETAL:  She has 50-60 degrees of flexion with discomfort.  She had more discomfort with extension and facet maneuvers which really more reproduced her pain.  Strength remains 5/5, 1+ reflexes.  Normal sensory exam.  Gait remains slightly wide-based, but she remains morbidly obese.  ASSESSMENT:  Chronic low back pain with lumbar facet arthropathy.  She has had less than optimal results with the medial branch blocks.  We talked about the fact that these blocks are more to help decrease her pain level, so she can pursue an exercise program.  They are not curative and appears sets.  PLAN: 1. I will send her for outpatient physical therapy work on home     exercise program and range of motion to focus on flexion and better     posture and truncal strength and control. 2. We talked about appropriate diet, exercise, and weight loss.  She     is seeing a dietitian to help her with weight loss.  I think she is     on the right track.  I think her exercise will need to be more     vigorous for her to  lose the weight, however. 3. We gave her tramadol to help her sleep at night 50 mg one q.6 hours     p.r.n. #60. 4. I will see the patient back here in about 1-2 months' time.     [name], M.D. Electronically Signed    ZTS/MedQ D:  06/09/2010 12:55:21  T:  06/10/2010 02:15:59  Job #:  [id number]  cc:   Ursula Beath, MD Fax: [phone]

## 2010-07-06 ENCOUNTER — Encounter (HOSPITAL_BASED_OUTPATIENT_CLINIC_OR_DEPARTMENT_OTHER): Payer: Medicaid Other | Admitting: Internal Medicine

## 2010-07-06 DIAGNOSIS — D509 Iron deficiency anemia, unspecified: Secondary | ICD-10-CM

## 2010-08-03 ENCOUNTER — Encounter: Payer: Medicaid Other | Admitting: Physical Medicine & Rehabilitation

## 2010-08-03 ENCOUNTER — Encounter (HOSPITAL_BASED_OUTPATIENT_CLINIC_OR_DEPARTMENT_OTHER)
Admission: RE | Admit: 2010-08-03 | Discharge: 2010-08-03 | Disposition: A | Discharge status: Home / Self Care | Payer: Medicaid Other | Source: Ambulatory Visit | Attending: Orthopedic Surgery | Admitting: Orthopedic Surgery

## 2010-08-03 LAB — BASIC METABOLIC PANEL
BUN: 8 mg/dL (ref 6–23)
CO2: 28 mEq/L (ref 19–32)
Calcium: 8.8 mg/dL (ref 8.4–10.5)
Chloride: 100 mEq/L (ref 96–112)
Creatinine, Ser: 0.58 mg/dL (ref 0.50–1.10)
GFR calc Af Amer: >60 mL/min (ref >60)
GFR calc non Af Amer: >60 mL/min (ref >60)
Glucose, Bld: 95 mg/dL (ref 70–99)
Potassium: 4 mEq/L (ref 3.5–5.1)
Sodium: 137 mEq/L (ref 135–145)

## 2010-08-05 ENCOUNTER — Ambulatory Visit (HOSPITAL_BASED_OUTPATIENT_CLINIC_OR_DEPARTMENT_OTHER)
Admission: RE | Admit: 2010-08-05 | Discharge: 2010-08-06 | Disposition: A | Discharge status: Home / Self Care | Payer: Medicaid Other | Source: Ambulatory Visit | Attending: Orthopedic Surgery | Admitting: Orthopedic Surgery

## 2010-08-05 DIAGNOSIS — D573 Sickle-cell trait: Secondary | ICD-10-CM | POA: Insufficient documentation

## 2010-08-05 DIAGNOSIS — Z01812 Encounter for preprocedural laboratory examination: Secondary | ICD-10-CM | POA: Insufficient documentation

## 2010-08-05 DIAGNOSIS — M6240 Contracture of muscle, unspecified site: Secondary | ICD-10-CM | POA: Insufficient documentation

## 2010-08-05 DIAGNOSIS — Q742 Other congenital malformations of lower limb(s), including pelvic girdle: Secondary | ICD-10-CM | POA: Insufficient documentation

## 2010-08-05 DIAGNOSIS — E669 Obesity, unspecified: Secondary | ICD-10-CM | POA: Insufficient documentation

## 2010-08-05 LAB — POCT HEMOGLOBIN-HEMACUE: Hemoglobin: 12.9 g/dL (ref 12.0–15.0)

## 2010-09-03 NOTE — Op Note (Signed)
[name], [name] NO.:  [id number]  MEDICAL RECORD NO.:  [id number]  LOCATION:                                 FACILITY:  PHYSICIAN:  Leonides Grills, M.D.     DATE OF BIRTH:  October 14, 1970  DATE OF PROCEDURE:  08/05/2010 DATE OF DISCHARGE:                              OPERATIVE REPORT   PREOPERATIVE DIAGNOSES: 1. Right accessory navicular. 2. Right grade 2 posttibial tendon insufficiency. 3. Right tight gastroc. 4. Right hindfoot valgus malalignment. 5. Obesity.  POSTOPERATIVE DIAGNOSES: 1. Right accessory navicular. 2. Right grade 2 posttibial tendon insufficiency. 3. Right tight gastroc. 4. Right hindfoot valgus malalignment. 5. Obesity.  OPERATIONS: 1. Right Kidner procedure. 2. Right gastroc slide. 3. Right medialized calcaneal osteotomy. 4. Right flexor digitorum longus to navicular tendon transfer. 5. Stress x-rays right foot.  ANESTHESIA:  General.  SURGEON:  Leonides Grills, MD  ASSISTANT:  Richardean Canal, PA  ESTIMATED BLOOD LOSS:  Minimal.  TOURNIQUET:  None.  COMPLICATIONS:  None.  DISPOSITION:  Stable to PR.  INDICATIONS:  A 40 year old obese female who has had persistent pain due to the above pathology who was interfering with her life where she cannot do what she wants to do despite conservative management.  She was consented for the above procedure.  She was consented for the above procedure.  All risks of infection, vessel injury, nonunion, malunion, hardware irritation, hardware failure, persistent pain, worsening pain, prolonged recovery, stiffness, arthritis, wound healing problems, DVT, PE were all explained.  Questions were encouraged and answered.  OPERATION:  The patient was brought to the operating room and placed in supine position.  After adequate general endotracheal anesthesia was administered with block as well as Ancef 1 gram IV piggyback, right lower extremity was prepped and draped in sterile manner.  No  tourniquet was used due to the patient's obesity.  With the right lower extremity prepped and draped in sterile manner, a longitudinal incision on the medial aspect of gastrocnemius muscle tendinous junction was then made. Dissection was carried down through the skin.  Hemostasis was obtained. Fascia was opened in line with the incision.  Again due to the fact of the patient's obesity, this has made the procedure much more difficult. We then elevated the soft tissue off the posterior aspect of the gastrocnemius.  Sural nerves were then identified and protected posteriorly throughout the case.  A Vulpius-type gastroc procedure was then performed.  This had excellent release of tight gastroc.  The area was copiously irrigated with normal saline.  Subcu was closed with 3-0 Vicryl.  Skin was closed with 4-0 nylon.  We then made a longitudinal incision over the lateral aspect of the calcaneal tuber perpendicular to the calcaneal pitch.  Dissection was carried down through skin. Hemostasis was obtained.  Soft tissue was elevated off the lateral aspect calcaneal tuber.  Then with 2 Homans placed superiorly and inferiorly, a sagittal saw was used to create an osteotomy perpendicular to lateral wall calcaneus and the calcaneal pitch.  Once this was done, we then stretched the soft tissues locally with a lamina spreader and then translated the heel about a centimeter medially through the osteotomy.  We  then provisionally fixed this with a 2 mm K-wire.  We then made a longitudinal incision on the center on the posterior aspect of the heel.  We then placed two 6.5 mm cannulated 16 mm threaded Arthrex screws which were self-drilling, self-tapping over the K-wire. This had excellent purchase and maintenance of the desired position.  We then obtained stress x-rays in the lateral and axial views and showed no gross motion fixation, proposition, excellent alignment as well.  Again due to the patient's  obesity and the fact that we could not use a tourniquet, this made the procedure much more difficult.  We then made a longitudinal incision over the posttibial tendon.  Dissection was carried down through skin.  Hemostasis was obtained.  Again due to the patient's obesity, this made this entire procedure much more difficult. We then incised the flexor retinaculum over the posterior tibial tendon, FDL tendon was identified.  There was synovitis in this area.  This was also removed and a tenosynovectomy was performed in the posterior tibial tendon.  We then elevated the posterior tibial tendon off of the navicular tuberosity and accessory navicular.  Once this was done, we used a sagittal saw, removed the medial aspect of the navicular tuberosity.  We then copiously irrigated the area with normal saline. We then identified the FDL tendon and traced this to the knot of Henry and tenotomized this distal as possible.  We then made a longitudinal incision through the posterior tibial tendon and then we pulled the FDL through this incision so that this would be incorporated not only with the posterior tibial tendon but also with the navicular bone as well. We created a trough in the navicular bone to accommodate this.  We then drilled with a 1.8-mm drill into the medial aspect of the navicular tuberosity.  We then placed a 2.0 Arthrex mini suture anchor with 2-0 FiberWire stitch into this drill hole.  This had excellent purchase.  We then advanced the posterior tibial tendon into this exposed navicular bone using the 2-0 FiberWire stitch.  This had outstanding repair.  We then sewed superiorly to the local periosteum using 2-0 FiberWire stitch as well advancing the posterior tibial tendon to this area.  This had an outstanding and strong repair.  We then further repaired the FDL by using a 2-0 Vicryl stitch and again this had an outstanding repair. There was no tourniquet used throughout the case  and this entire procedure was much more difficult due to the patient's obesity.  The area was copiously irrigated with normal saline.  Subcu was closed with 3-0 Vicryl.  The skin was closed 4-0 nylon overall wounds.  Sterile dressing was applied.  Modified Jones dressing was applied.  The patient was stable to the PR.     Leonides Grills, M.D.     PB/MEDQ  D:  08/05/2010  T:  08/06/2010  Job:  [id number]  Electronically Signed by Leonides Grills M.D. on 09/03/2010 03:44:52 PM

## 2010-10-15 LAB — POCT URINALYSIS DIP (DEVICE)
Glucose, UA: NEGATIVE
Hgb urine dipstick: NEGATIVE
Ketones, ur: NEGATIVE
Nitrite: NEGATIVE
Operator id: 239701
Protein, ur: NEGATIVE
Specific Gravity, Urine: 1.015
Urobilinogen, UA: 0.2
pH: 5.5

## 2010-10-15 LAB — POCT PREGNANCY, URINE
Operator id: 247071
Preg Test, Ur: NEGATIVE

## 2010-10-19 LAB — POCT URINALYSIS DIP (DEVICE)
Glucose, UA: NEGATIVE
Hgb urine dipstick: NEGATIVE
Nitrite: NEGATIVE
Operator id: 235561
Protein, ur: 30 — AB
Specific Gravity, Urine: 1.02
Urobilinogen, UA: 0.2
pH: 5.5

## 2010-10-19 LAB — URINE CULTURE: Colony Count: 50000

## 2010-10-19 LAB — POCT PREGNANCY, URINE: Preg Test, Ur: NEGATIVE

## 2010-10-21 LAB — URINE CULTURE

## 2010-10-21 LAB — POCT URINALYSIS DIP (DEVICE)
Bilirubin Urine: NEGATIVE
Glucose, UA: NEGATIVE
Hgb urine dipstick: NEGATIVE
Ketones, ur: NEGATIVE
Nitrite: NEGATIVE
Operator id: 282151
Protein, ur: NEGATIVE
Specific Gravity, Urine: <=1.005
Urobilinogen, UA: 0.2
pH: 6.5

## 2010-10-30 LAB — POCT RAPID STREP A: Streptococcus, Group A Screen (Direct): NEGATIVE

## 2010-11-03 ENCOUNTER — Ambulatory Visit: Payer: Medicaid Other

## 2011-01-10 IMAGING — CR DG ANKLE COMPLETE 3+V*R*
3 series · 3 of 3 positions shown · non-contrast
Comparison: None.

CLINICAL DATA: Swollen right foot.  Foot pain.

RIGHT ANKLE - COMPLETE 3+ VIEW

[view not recorded (1 of 3)]
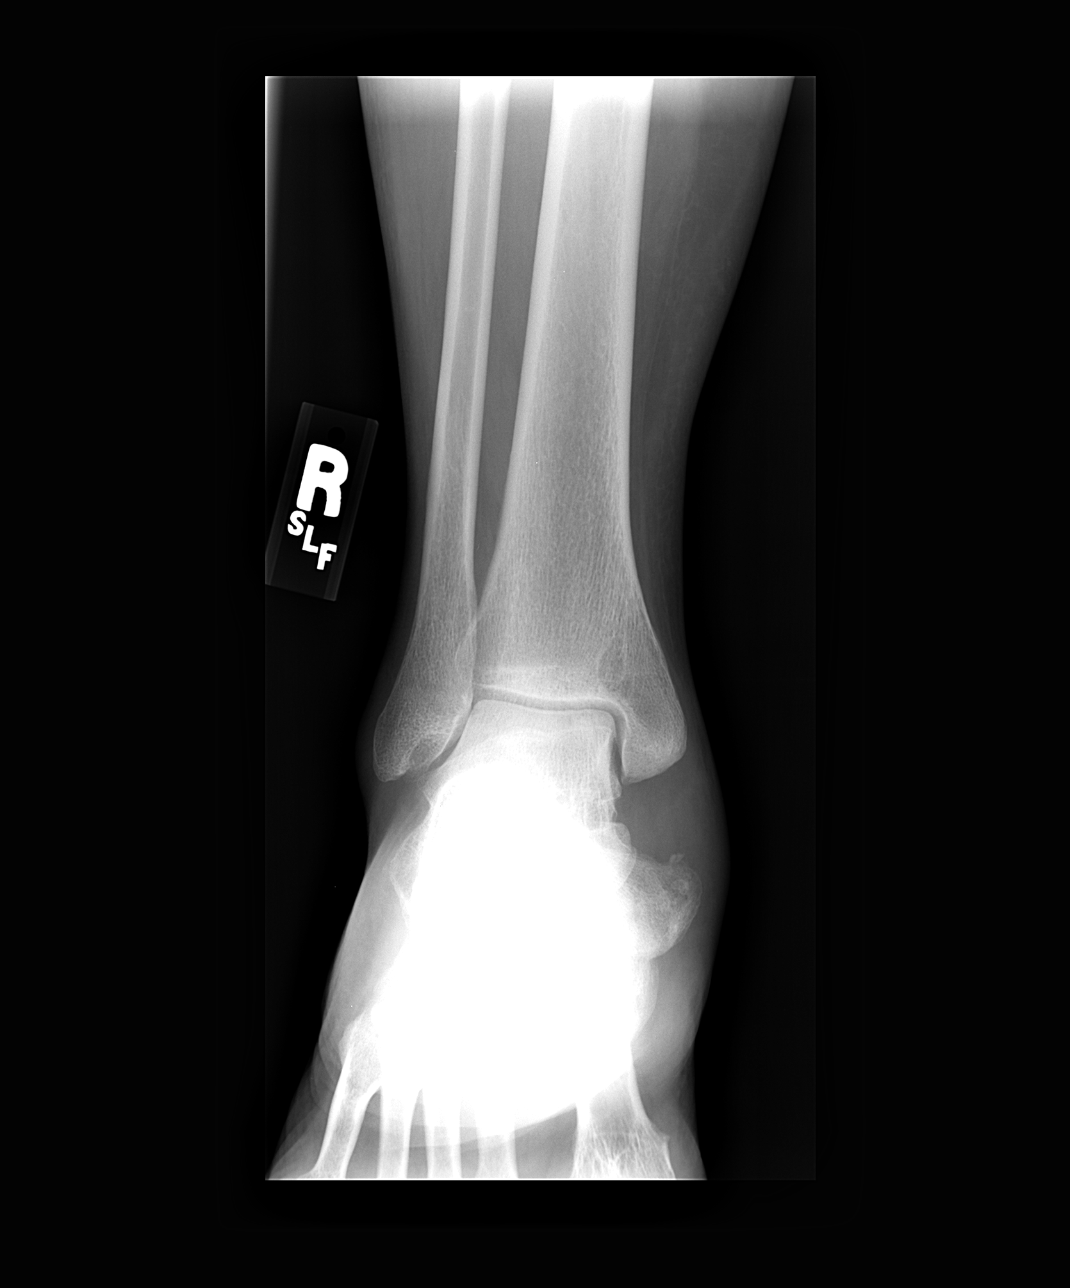

[view not recorded (2 of 3)]
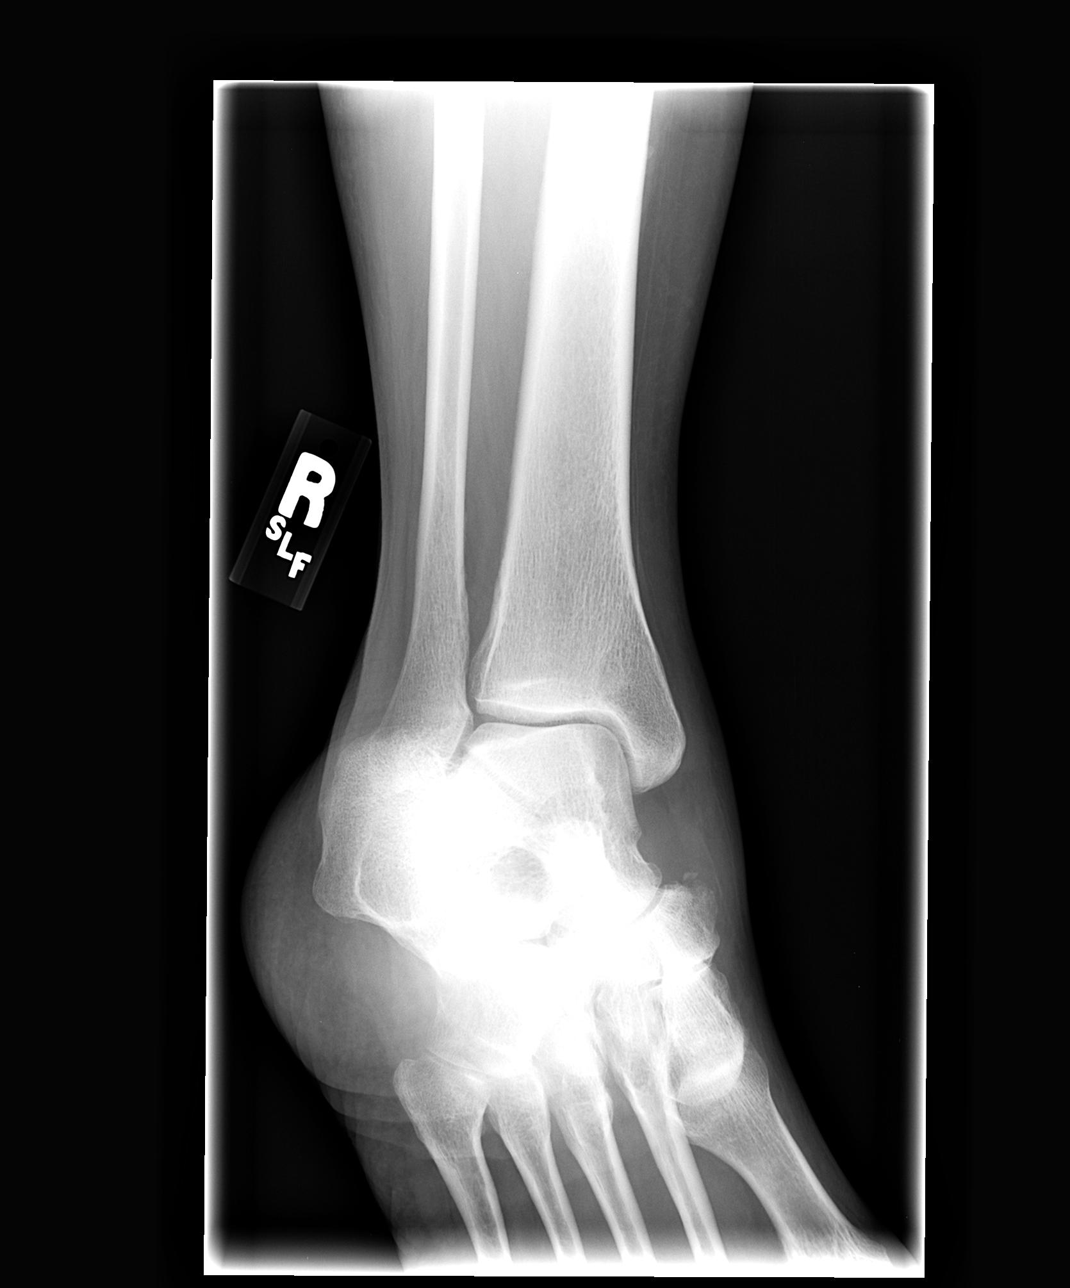

[view not recorded (3 of 3)]
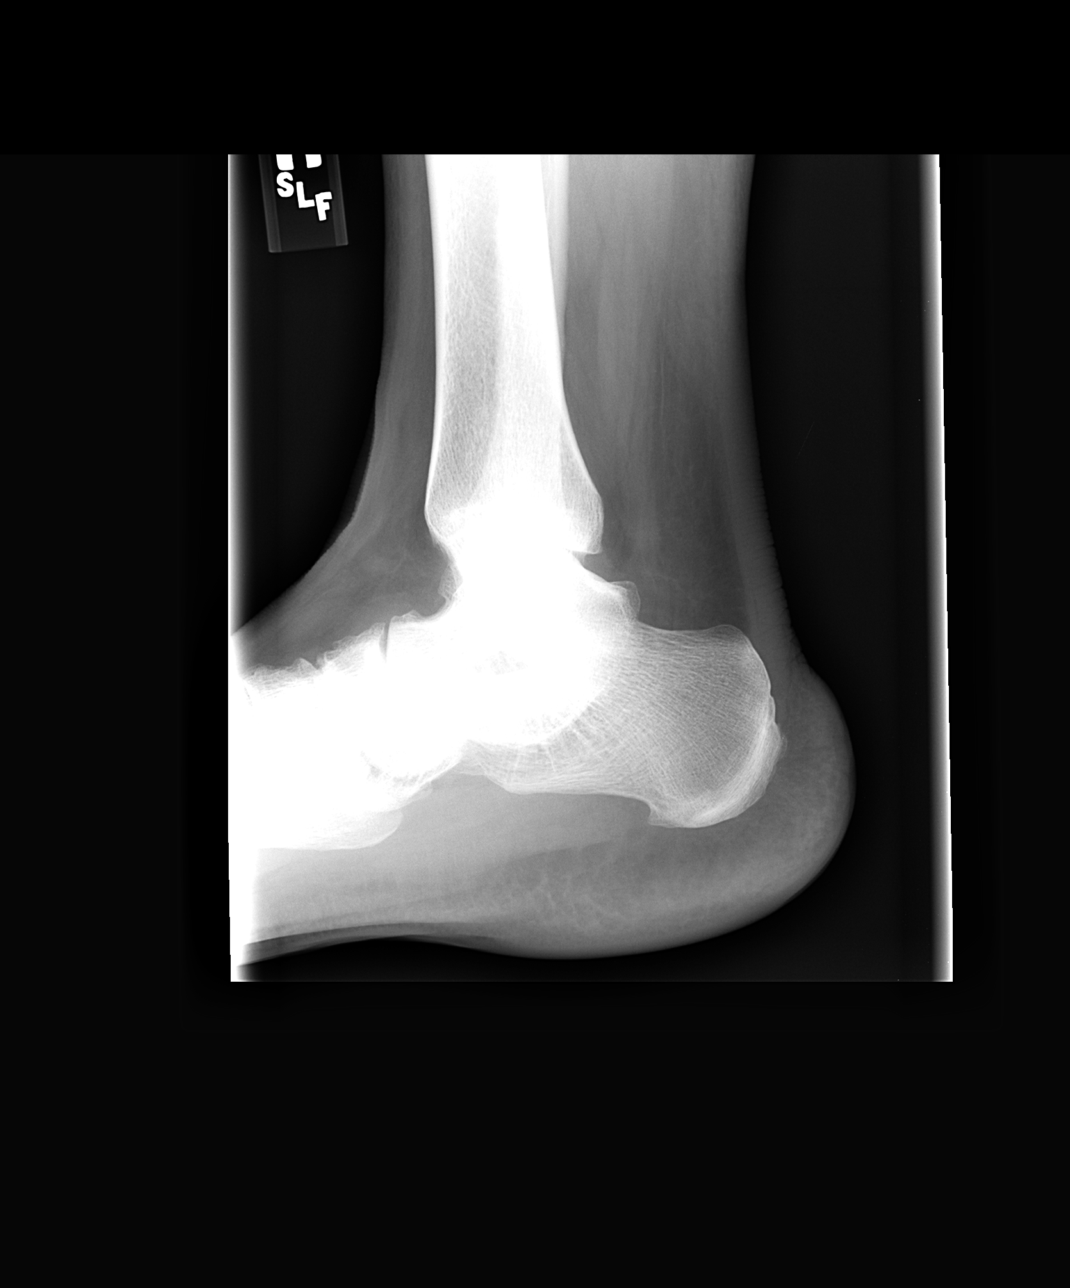

[3 of 3 positions shown; findings below may reference images not displayed]

FINDINGS: Soft tissue swelling is present overlying the medial
malleolus.  There is a small cystic lesion in the medial tibial
plafond measuring 12 mm, likely representing a small subchondral
geode.

There is protuberant bone in the medial aspect of the foot.  Small
flecks of bone are present nearby.  Based on these views, this
probably represents a prominent cornua navicular projecting into
the medial hind foot.  Calcaneus appears within normal limits on
the lateral view aside from a tiny plantar spur.  No  ankle
effusion.  No fracture.
IMPRESSION: 1.  Soft tissue swelling overlying the medial malleolus.  No acute
osseous  abnormality.
2.  Protuberant bone on the frontal view likely represents cornuate
navicular with associated degenerative changes at the insertion of
the posterior tibial tendon.  If there is clinical concern,
consider obtaining dedicated foot radiographs.

## 2011-03-31 DIAGNOSIS — I10 Essential (primary) hypertension: Secondary | ICD-10-CM | POA: Insufficient documentation

## 2011-09-13 DIAGNOSIS — G43909 Migraine, unspecified, not intractable, without status migrainosus: Secondary | ICD-10-CM | POA: Insufficient documentation

## 2011-10-18 DIAGNOSIS — M47816 Spondylosis without myelopathy or radiculopathy, lumbar region: Secondary | ICD-10-CM | POA: Insufficient documentation

## 2012-01-07 ENCOUNTER — Encounter (HOSPITAL_BASED_OUTPATIENT_CLINIC_OR_DEPARTMENT_OTHER): Payer: Medicaid Other

## 2012-05-10 ENCOUNTER — Other Ambulatory Visit: Payer: Self-pay | Admitting: Physician Assistant

## 2012-05-10 DIAGNOSIS — Z1231 Encounter for screening mammogram for malignant neoplasm of breast: Secondary | ICD-10-CM

## 2012-05-18 ENCOUNTER — Ambulatory Visit: Payer: Medicaid Other

## 2012-05-19 ENCOUNTER — Ambulatory Visit
Admission: RE | Admit: 2012-05-19 | Discharge: 2012-05-19 | Disposition: A | Discharge status: Home / Self Care | Payer: Medicaid Other | Source: Ambulatory Visit | Attending: Physician Assistant | Admitting: Physician Assistant

## 2012-05-19 DIAGNOSIS — Z1231 Encounter for screening mammogram for malignant neoplasm of breast: Secondary | ICD-10-CM

## 2013-04-02 ENCOUNTER — Ambulatory Visit: Payer: Medicaid Other | Admitting: Physician Assistant

## 2013-09-18 ENCOUNTER — Ambulatory Visit
Admission: RE | Admit: 2013-09-18 | Discharge: 2013-09-18 | Disposition: A | Discharge status: Home / Self Care | Payer: 59 | Source: Ambulatory Visit | Attending: Pain Medicine | Admitting: Pain Medicine

## 2013-09-18 ENCOUNTER — Other Ambulatory Visit: Payer: Self-pay | Admitting: Pain Medicine

## 2013-09-18 DIAGNOSIS — M25561 Pain in right knee: Secondary | ICD-10-CM

## 2013-10-18 ENCOUNTER — Other Ambulatory Visit: Payer: Self-pay | Admitting: Pain Medicine

## 2013-10-18 DIAGNOSIS — M545 Low back pain, unspecified: Secondary | ICD-10-CM

## 2013-10-18 DIAGNOSIS — M5136 Other intervertebral disc degeneration, lumbar region: Secondary | ICD-10-CM

## 2013-10-21 ENCOUNTER — Other Ambulatory Visit: Payer: 59

## 2013-10-24 ENCOUNTER — Other Ambulatory Visit: Payer: 59

## 2013-10-29 ENCOUNTER — Inpatient Hospital Stay: Admission: RE | Admit: 2013-10-29 | Payer: 59 | Source: Ambulatory Visit

## 2013-11-02 ENCOUNTER — Other Ambulatory Visit: Payer: Self-pay

## 2013-11-02 DIAGNOSIS — Z1231 Encounter for screening mammogram for malignant neoplasm of breast: Secondary | ICD-10-CM

## 2013-11-21 ENCOUNTER — Ambulatory Visit
Admission: RE | Admit: 2013-11-21 | Discharge: 2013-11-21 | Disposition: A | Discharge status: Home / Self Care | Payer: 59 | Source: Ambulatory Visit

## 2013-11-21 ENCOUNTER — Encounter (INDEPENDENT_AMBULATORY_CARE_PROVIDER_SITE_OTHER): Payer: Self-pay

## 2013-11-21 DIAGNOSIS — Z1231 Encounter for screening mammogram for malignant neoplasm of breast: Secondary | ICD-10-CM

## 2013-11-22 ENCOUNTER — Ambulatory Visit
Admission: RE | Admit: 2013-11-22 | Discharge: 2013-11-22 | Disposition: A | Discharge status: Home / Self Care | Payer: 59 | Source: Ambulatory Visit | Attending: Pain Medicine | Admitting: Pain Medicine

## 2013-11-22 DIAGNOSIS — M5136 Other intervertebral disc degeneration, lumbar region: Secondary | ICD-10-CM

## 2013-11-22 DIAGNOSIS — M545 Low back pain, unspecified: Secondary | ICD-10-CM

## 2013-11-30 ENCOUNTER — Other Ambulatory Visit: Payer: 59

## 2013-12-04 ENCOUNTER — Emergency Department (INDEPENDENT_AMBULATORY_CARE_PROVIDER_SITE_OTHER): Payer: 59

## 2013-12-04 ENCOUNTER — Emergency Department (INDEPENDENT_AMBULATORY_CARE_PROVIDER_SITE_OTHER)
Admission: EM | Admit: 2013-12-04 | Discharge: 2013-12-04 | Disposition: A | Discharge status: Home / Self Care | Payer: Self-pay | Source: Home / Self Care | Attending: Emergency Medicine | Admitting: Emergency Medicine

## 2013-12-04 ENCOUNTER — Encounter (HOSPITAL_COMMUNITY): Payer: Self-pay | Admitting: *Deleted

## 2013-12-04 DIAGNOSIS — S46912A Strain of unspecified muscle, fascia and tendon at shoulder and upper arm level, left arm, initial encounter: Secondary | ICD-10-CM

## 2013-12-04 DIAGNOSIS — M25519 Pain in unspecified shoulder: Secondary | ICD-10-CM

## 2013-12-04 HISTORY — DX: Essential (primary) hypertension: I10

## 2013-12-04 MED ORDER — HYDROCODONE-ACETAMINOPHEN 5-325 MG PO TABS: 1.0000 | ORAL_TABLET | Freq: Four times a day (QID) | ORAL | Status: DC | PRN

## 2013-12-04 MED ORDER — CYCLOBENZAPRINE HCL 10 MG PO TABS: 10.0000 mg | ORAL_TABLET | Freq: Every evening | ORAL | Status: DC | PRN

## 2013-12-04 MED ORDER — IBUPROFEN 800 MG PO TABS: 800.0000 mg | ORAL_TABLET | Freq: Three times a day (TID) | ORAL | Status: DC | PRN
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
## 2013-12-04 NOTE — Discharge Instructions (Signed)
You have strained your shoulder. Apply ice 3 times a day. Take ibuprofen 800mg  3 times a day for the next 4 days, then as needed. Take flexeril at bedtime.  This will make you sleepy. Use the norco as needed for pain.  Do not drive while taking this medication. Wear the sling for comfort. Starting on Thursday, start doing gentle range of motion exercises.  Follow up with PCP if no improvement in 1-2 weeks.
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
## 2014-01-31 ENCOUNTER — Other Ambulatory Visit (INDEPENDENT_AMBULATORY_CARE_PROVIDER_SITE_OTHER): Payer: Self-pay | Admitting: General Surgery

## 2014-01-31 DIAGNOSIS — I10 Essential (primary) hypertension: Secondary | ICD-10-CM

## 2014-02-26 ENCOUNTER — Ambulatory Visit (HOSPITAL_COMMUNITY)
Admission: RE | Admit: 2014-02-26 | Discharge: 2014-02-26 | Disposition: A | Discharge status: Home / Self Care | Payer: 59 | Source: Ambulatory Visit | Attending: General Surgery | Admitting: General Surgery

## 2014-02-26 ENCOUNTER — Encounter (INDEPENDENT_AMBULATORY_CARE_PROVIDER_SITE_OTHER): Payer: Self-pay

## 2014-02-26 ENCOUNTER — Other Ambulatory Visit: Payer: Self-pay

## 2014-02-26 DIAGNOSIS — M179 Osteoarthritis of knee, unspecified: Secondary | ICD-10-CM | POA: Insufficient documentation

## 2014-02-26 DIAGNOSIS — I10 Essential (primary) hypertension: Secondary | ICD-10-CM | POA: Insufficient documentation

## 2014-02-26 DIAGNOSIS — Z6841 Body Mass Index (BMI) 40.0 and over, adult: Secondary | ICD-10-CM | POA: Insufficient documentation

## 2014-02-26 DIAGNOSIS — M1990 Unspecified osteoarthritis, unspecified site: Secondary | ICD-10-CM | POA: Insufficient documentation

## 2014-03-14 ENCOUNTER — Encounter: Payer: 59 | Attending: General Surgery | Admitting: Dietician

## 2014-03-14 ENCOUNTER — Encounter: Payer: Self-pay | Admitting: Dietician

## 2014-03-14 DIAGNOSIS — Z6841 Body Mass Index (BMI) 40.0 and over, adult: Secondary | ICD-10-CM | POA: Diagnosis not present

## 2014-03-14 DIAGNOSIS — Z713 Dietary counseling and surveillance: Secondary | ICD-10-CM | POA: Diagnosis not present

## 2014-03-14 NOTE — Progress Notes (Signed)
  Pre-Op Assessment Visit:  Pre-Operative Gastric sleeve Surgery  Medical Nutrition Therapy:  Appt start time: 0830   End time:  0900.  Patient was seen on 03/14/2014 for Pre-Operative Nutrition Assessment. Assessment and letter of approval [facility] to [facility] coordinator on 03/14/2014.   Preferred Learning Style:   No preference indicated   Learning Readiness:   Ready  Handouts given during visit include:  Pre-Op Goals Bariatric Surgery Protein Shakes   During the appointment today the following Pre-Op Goals were reviewed with the patient: Maintain or lose weight as instructed by your surgeon Make healthy food choices Begin to limit portion sizes Limited concentrated sugars and fried foods Keep fat/sugar in the single digits per serving on   food labels Practice CHEWING your food  (aim for 30 chews per bite or until applesauce consistency) Practice not drinking 15 minutes before, during, and 30 minutes after each meal/snack Avoid all carbonated beverages  Avoid/limit caffeinated beverages  Avoid all sugar-sweetened beverages Consume 3 meals per day; eat every 3-5 hours Make a list of non-food related activities Aim for 64-100 ounces of FLUID daily  Aim for at least 60-80 grams of PROTEIN daily Look for a liquid protein source that contain ?15 g protein and ?5 g carbohydrate  (ex: shakes, drinks, shots)  Patient-Centered Goals: -Back and knee pain relief  Scale of 1-10: confidence (8.5-9) / importance(10)  Demonstrated degree of understanding via:  Teach Back  Teaching Method Utilized:  Visual Auditory Hands on  Barriers to learning/adherence to lifestyle change: none  [facility] to [facility] the [facility] to enroll in Pre-Op and Post-Op Nutrition Education when surgery date is scheduled.

## 2014-03-14 NOTE — Patient Instructions (Signed)

## 2014-04-11 ENCOUNTER — Encounter: Payer: 59 | Attending: General Surgery | Admitting: Dietician

## 2014-04-11 DIAGNOSIS — Z6841 Body Mass Index (BMI) 40.0 and over, adult: Secondary | ICD-10-CM | POA: Diagnosis not present

## 2014-04-11 DIAGNOSIS — Z713 Dietary counseling and surveillance: Secondary | ICD-10-CM | POA: Insufficient documentation

## 2014-04-11 NOTE — Progress Notes (Signed)
  6 Months Supervised Weight Loss Visit:   Pre-Operative Sleeve Gastrectomy Surgery  Medical Nutrition Therapy:  Appt start time: 0915 end time:  0930.  Primary concerns today: Supervised Weight Loss Visit. Has been practiced chewing well and started walking more. Has trouble eating breakfast though working on it. Having about 2 meals per day and more snacks. Having trail mix, veggies with ranch, chips, or rice crispy treats. Drinks water, orange juice, sprite very rarely. Does not like milk and has not liked any protein shakes.   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   Nutritional Diagnosis:  -3.3 Obesity related to past poor dietary habits and physical inactivity as evidenced by patient attending supervised weight loss for insurance approval of bariatric surgery.    Intervention:  Nutrition counseling provided. Plan: Work on getting on a structured meal schedule. Try Atkins Lift shake or Isopure (juice-like). Try boiling eggs to have in the morning or for a snack. Try jerky or cheese sticks for snacks. Keep working on chewing well and not drinking while you are eating.  Teaching Method Utilized:  Visual Auditory Hands on  Barriers to learning/adherence to lifestyle change: none  Demonstrated degree of understanding via:  Teach Back   Monitoring/Evaluation:  Dietary intake, exercise, and body weight. Follow up in 1 months for 6 month supervised weight loss visit.

## 2014-04-11 NOTE — Patient Instructions (Addendum)
Work on getting on a structured meal schedule. Try Atkins Lift shake or Isopure (juice-like). Try boiling eggs to have in the morning or for a snack. Try jerky or cheese sticks for snacks. Keep working on chewing well and not drinking while you are eating.

## 2014-05-13 ENCOUNTER — Encounter: Payer: 59 | Attending: General Surgery | Admitting: Dietician

## 2014-05-13 DIAGNOSIS — Z713 Dietary counseling and surveillance: Secondary | ICD-10-CM | POA: Insufficient documentation

## 2014-05-13 DIAGNOSIS — Z6841 Body Mass Index (BMI) 40.0 and over, adult: Secondary | ICD-10-CM | POA: Diagnosis not present

## 2014-05-13 NOTE — Progress Notes (Signed)
  6 Months Supervised Weight Loss Visit:   Pre-Operative Sleeve Gastrectomy Surgery  Medical Nutrition Therapy:  Appt start time: 0925 end time:  0940.  Primary concerns today: Supervised Weight Loss Visit. Has gained 2 lbs since last visit. Went on a weekend cruise in the past month. Tried to eat salads and avoid alcohol. Has been making a protein shake with muscle milk and G2. Has not had the money to try clear protein shakes. Has been working on having 3 meals per day. Having trail mix instead of chips. Cutting back on juice.   Has been practiced chewing well and started walking more.    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   Nutritional Diagnosis:  [place]-3.3 Obesity related to past poor dietary habits and physical inactivity as evidenced by patient attending supervised weight loss for insurance approval of bariatric surgery.    Intervention:  Nutrition counseling provided. Plan: Continue to work on getting on a structured meal schedule. Try Atkins Lift shake or Isopure (juice-like) or unflavored unjury/Genepro (online). Try boiling eggs, jerky/turkey sticks, or cheese sticks. to have in the morning or for a snack. Keep working on chewing well and not drinking while you are eating (15 minutes before up through 30 minutes after). [facility] at [phone] when surgery is scheduled to enroll in Pre-Op Class.  Teaching Method Utilized:  Visual Auditory Hands on  Barriers to learning/adherence to lifestyle change: none  Demonstrated degree of understanding via:  Teach Back   Monitoring/Evaluation:  Dietary intake, exercise, and body weight. Follow  up for Pre-Op Class

## 2014-05-13 NOTE — Patient Instructions (Addendum)
Continue to work on getting on a structured meal schedule. Try Atkins Lift shake or Isopure (juice-like) or unflavored unjury/Genepro (online). Try boiling eggs, jerky/turkey sticks, or cheese sticks. to have in the morning or for a snack. Keep working on chewing well and not drinking while you are eating (15 minutes before up through 30 minutes after). [facility] at [phone] when surgery is scheduled to enroll in Pre-Op Class.

## 2015-01-22 ENCOUNTER — Ambulatory Visit
Admission: RE | Admit: 2015-01-22 | Discharge: 2015-01-22 | Disposition: A | Discharge status: Home / Self Care | Payer: BLUE CROSS/BLUE SHIELD | Source: Ambulatory Visit | Attending: Family Medicine | Admitting: Family Medicine

## 2015-01-22 ENCOUNTER — Other Ambulatory Visit: Payer: Self-pay | Admitting: Family Medicine

## 2015-01-22 DIAGNOSIS — R52 Pain, unspecified: Secondary | ICD-10-CM

## 2016-02-21 IMAGING — CR DG ANKLE COMPLETE 3+V*R*
3 series · 3 of 3 positions shown · non-contrast
Comparison: None.

CLINICAL DATA: Fall 01/17/2015.  Pain

EXAM:
RIGHT ANKLE - COMPLETE 3+ VIEW

[t ankle joint ap right]
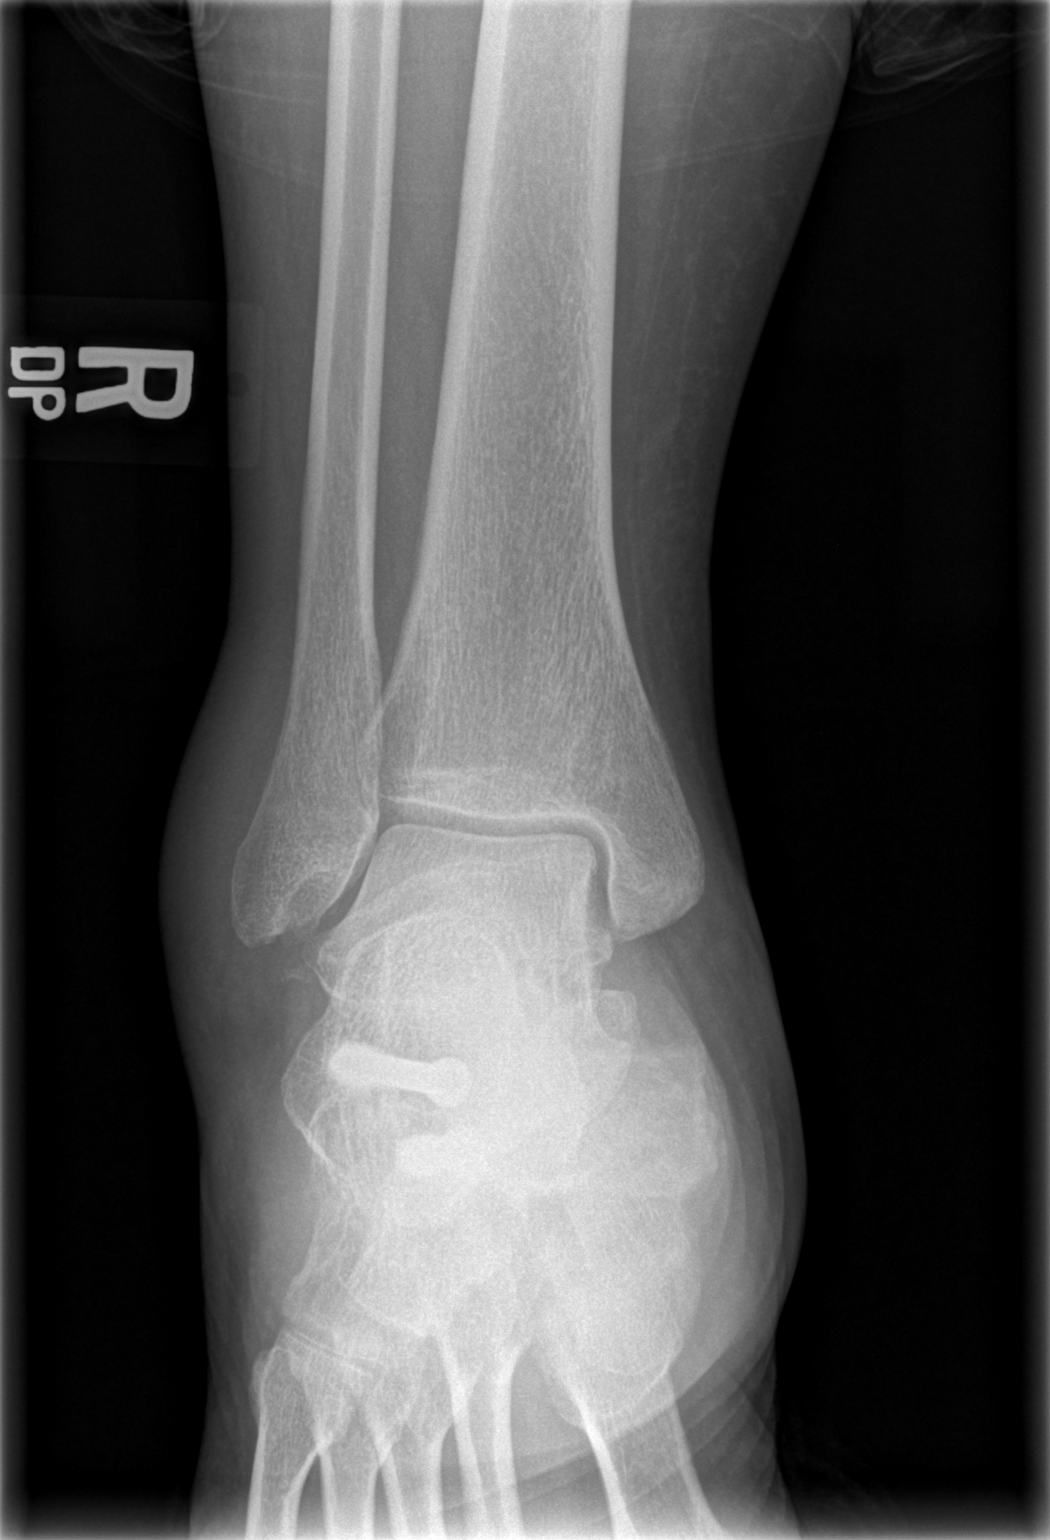

[t ankle joint oblique right]
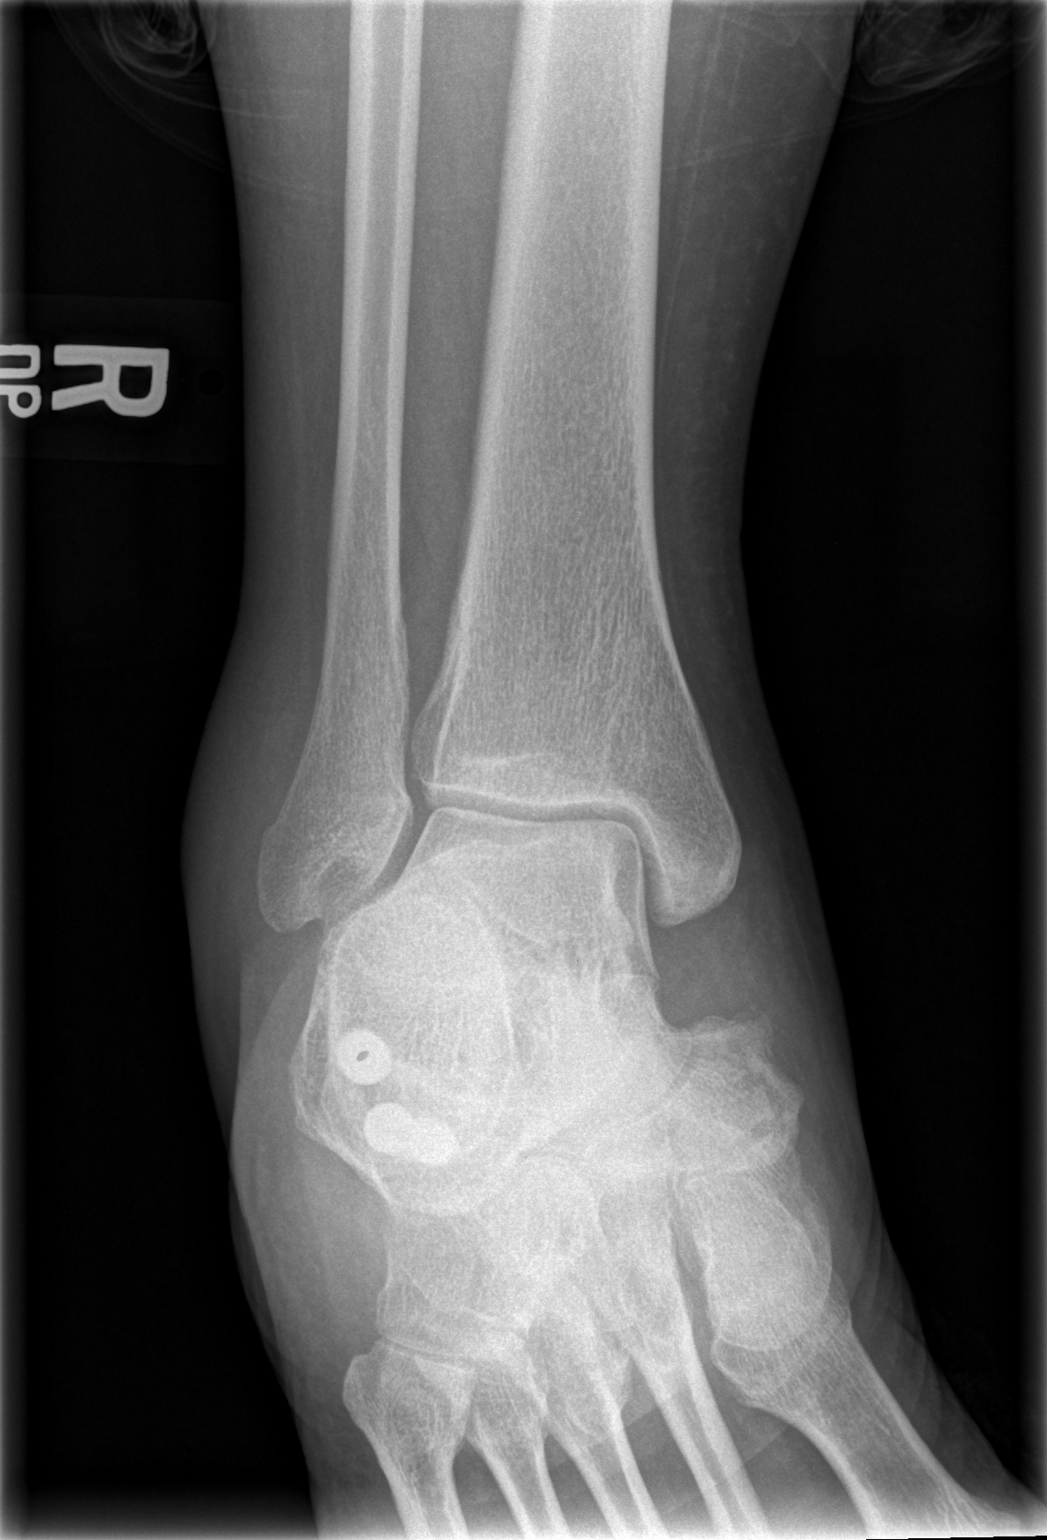

[t ankle joint lat right]
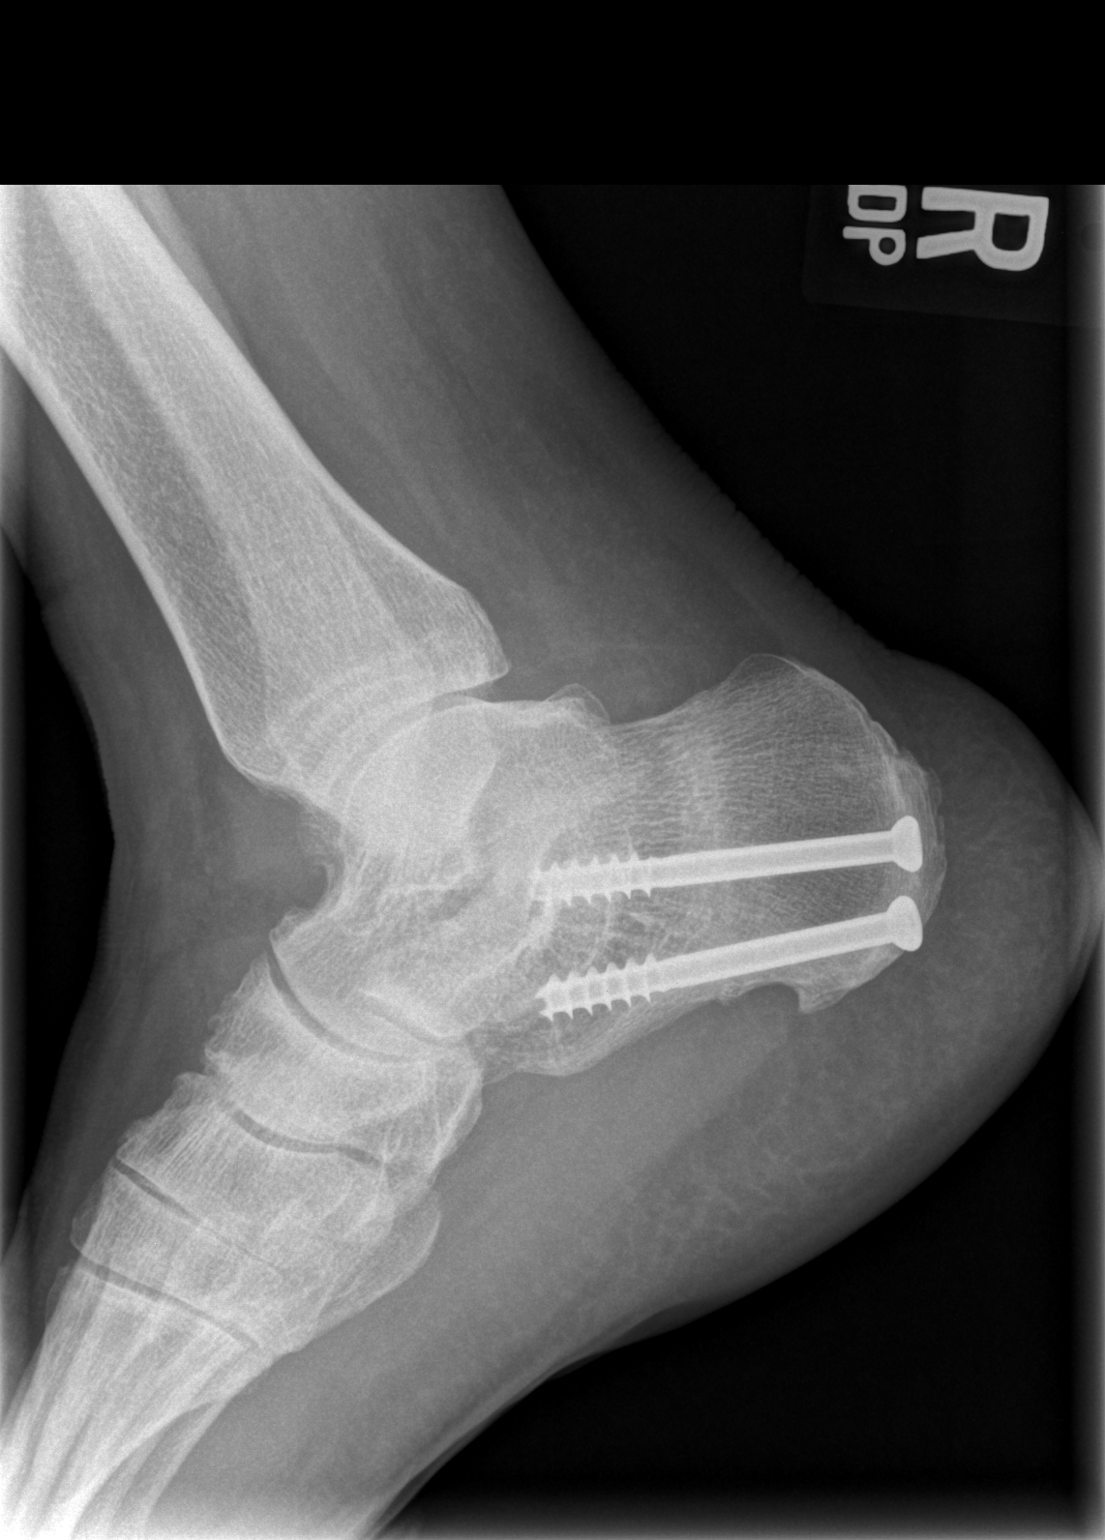

[3 of 3 positions shown; findings below may reference images not displayed]

FINDINGS: There is lateral soft tissue swelling. Small avulsed fragments noted
laterally, likely on the lateral malleolus. No acute bony
abnormality within the tibia. Postsurgical changes in the calcaneus.
Soft tissues are intact.
IMPRESSION: Small avulsed fragment laterally, likely off the lateral malleolus.
Overlying soft tissue swelling.

## 2016-05-09 ENCOUNTER — Encounter (HOSPITAL_COMMUNITY): Payer: Self-pay | Admitting: Emergency Medicine

## 2016-05-09 ENCOUNTER — Emergency Department (HOSPITAL_COMMUNITY)
Admission: EM | Admit: 2016-05-09 | Discharge: 2016-05-09 | Disposition: A | Discharge status: Home / Self Care | Payer: BLUE CROSS/BLUE SHIELD | Attending: Emergency Medicine | Admitting: Emergency Medicine

## 2016-05-09 DIAGNOSIS — M5442 Lumbago with sciatica, left side: Secondary | ICD-10-CM | POA: Diagnosis not present

## 2016-05-09 DIAGNOSIS — I10 Essential (primary) hypertension: Secondary | ICD-10-CM | POA: Insufficient documentation

## 2016-05-09 DIAGNOSIS — Z79899 Other long term (current) drug therapy: Secondary | ICD-10-CM | POA: Diagnosis not present

## 2016-05-09 DIAGNOSIS — M545 Low back pain: Secondary | ICD-10-CM | POA: Diagnosis present

## 2016-05-09 HISTORY — DX: Other intervertebral disc degeneration, lumbar region without mention of lumbar back pain or lower extremity pain: M51.369

## 2016-05-09 HISTORY — DX: Other intervertebral disc degeneration, lumbar region: M51.36

## 2016-05-09 LAB — I-STAT CHEM 8, ED
BUN: 9 mg/dL (ref 6–20)
Calcium, Ion: 1.04 mmol/L — ABNORMAL LOW (ref 1.15–1.40)
Chloride: 96 mmol/L — ABNORMAL LOW (ref 101–111)
Creatinine, Ser: 0.8 mg/dL (ref 0.44–1.00)
Glucose, Bld: 112 mg/dL — ABNORMAL HIGH (ref 65–99)
HCT: 38 % (ref 36.0–46.0)
Hemoglobin: 12.9 g/dL (ref 12.0–15.0)
Potassium: 3.5 mmol/L (ref 3.5–5.1)
Sodium: 139 mmol/L (ref 135–145)
TCO2: 31 mmol/L (ref 0–100)

## 2016-05-09 LAB — BASIC METABOLIC PANEL
Anion gap: 10 (ref 5–15)
BUN: 11 mg/dL (ref 6–20)
CO2: 31 mmol/L (ref 22–32)
Calcium: 8.9 mg/dL (ref 8.9–10.3)
Chloride: 97 mmol/L — ABNORMAL LOW (ref 101–111)
Creatinine, Ser: 0.78 mg/dL (ref 0.44–1.00)
GFR calc Af Amer: >60 mL/min (ref >60)
GFR calc non Af Amer: >60 mL/min (ref >60)
Glucose, Bld: 108 mg/dL — ABNORMAL HIGH (ref 65–99)
Potassium: 3.2 mmol/L — ABNORMAL LOW (ref 3.5–5.1)
Sodium: 138 mmol/L (ref 135–145)

## 2016-05-09 LAB — CBG MONITORING, ED: Glucose-Capillary: 104 mg/dL — ABNORMAL HIGH (ref 65–99)

## 2016-05-09 LAB — I-STAT BETA HCG BLOOD, ED (MC, WL, AP ONLY): I-stat hCG, quantitative: <5 m[IU]/mL (ref <5)

## 2016-05-09 MED ORDER — KETOROLAC TROMETHAMINE 30 MG/ML IJ SOLN
30.0000 mg | Freq: Once | INTRAMUSCULAR | Status: AC
  Administered 2016-05-09: 30 mg via INTRAVENOUS
  Filled 2016-05-09: qty 1

## 2016-05-09 MED ORDER — DEXAMETHASONE SODIUM PHOSPHATE 10 MG/ML IJ SOLN
10.0000 mg | Freq: Once | INTRAMUSCULAR | Status: AC
  Administered 2016-05-09: 10 mg via INTRAVENOUS
  Filled 2016-05-09: qty 1

## 2016-05-09 NOTE — ED Triage Notes (Signed)
Pt denies any urinary symptoms from pain. Pt denies any injury.

## 2016-05-09 NOTE — ED Notes (Signed)
ED Provider at bedside. 

## 2016-05-09 NOTE — ED Triage Notes (Signed)
Pt reports lower back pain with radiation into right leg from hip. Prior hx of DDD.

## 2016-05-09 NOTE — ED Notes (Signed)
Pt has a degenerative disc disorder and c/o 10/10 back pain that is uncontrollable by Percocet x2 ( ) and Ibuprofen.

## 2016-05-09 NOTE — Discharge Instructions (Signed)
Take her pain medications as previously directed for pain.  The shot gave in emergency department was similar to ibuprofen. Do not take any more ibuprofen until later tonight.  Follow-up to her primary care doctor in 24-48 hours for further evaluation.  Return to the emergency department for any worsening pain, fever, weakness or numbness of her arms or leg, or any other worsening or concerning symptoms.

## 2016-05-09 NOTE — ED Notes (Signed)
Pt refused to have vitals rechecked 

## 2016-05-09 NOTE — ED Provider Notes (Signed)
[facility]EMER[facility]ENCY DEPT Provider Note   CSN: [id number] Arrival date & time: 05/09/16  0040     History   Chief Complaint Chief Complaint  Patient presents with  . Back Pain    HPI [name] is a 46 y.o. female who presents percent with 2 days of worsening lower back pain (R>L). She states the pain is located in the lower region and radiates down the posterior aspect of her left lower extremity. She rates her pain as a 10/10. Pain is worsened with movement and improved with lying still. She has used warm bath and ice with no relief. She has previously prescribed Percocet that she takes as part of a pain management program and reports no improvement in pain with Percocet use. Initially she was also taking Flexeril but had to stop because she was having a reaction to it. She has been able to ambulate but with some worsening of her pain. Denies any bowel or bladder incontinence, saddle anesthesia, numbness or weakness of her extremities, fever, night sweats, weight loss, IV drug use, history of back surgery. No recent trauma or fall.  The history is provided by the patient.    Past Medical History:  Diagnosis Date  . DDD (degenerative disc disease), lumbar   . Hypertension     Patient Active Problem List   Diagnosis Date Noted  . ANEMIA NEC 07/06/2006  . HYPERTENSION, BENIGN ESSENTIAL 07/06/2006  . DEGENERATIVE JOINT DISEASE 07/06/2006  . OSTEOARTHROSIS NOS, LOWER LEG 07/06/2006    Past Surgical History:  Procedure Laterality Date  . FOOT FRACTURE SURGERY Right 2012  . KNEE SURGERY  1998  . TUBAL LIGATION  1999    OB History    No data available       Home Medications    Prior to Admission medications   Medication Sig Start Date End Date Taking? Authorizing Provider  cetirizine (ZYRTEC) 10 MG tablet Take 10 mg by mouth daily. 09/04/12  Yes Historical Provider, MD  docusate sodium (COLACE) 100 MG capsule Take 100 mg by mouth 2 (two) times daily.   Yes Historical  Provider, MD  hydrochlorothiazide (HYDRODIURIL) 25 MG tablet Take 25 tablets by mouth daily. 03/25/16  Yes Historical Provider, MD  ibuprofen (ADVIL,MOTRIN) 200 MG tablet Take 800 mg by mouth every 6 (six) hours as needed.   Yes Historical Provider, MD  oxyCODONE-acetaminophen (PERCOCET) 7.5-325 MG tablet Take 1 tablet by mouth 3 (three) times daily. 03/15/16  Yes Historical Provider, MD  triamcinolone cream (KENALOG) 0.1 % Apply 1 application topically daily. 05/06/16  Yes Historical Provider, MD  Vitamin D, Ergocalciferol, (DRISDOL) 50000 units CAPS capsule Take 50,000 Units by mouth once a week. Take once weekly on saturday 03/15/16  Yes Historical Provider, MD    Family History Family History  Problem Relation Age of Onset  . Kidney failure Mother   . Hypertension Mother   . Diabetes Mother   . Hypertension Sister   . Diabetes Sister   . Cancer Sister   . Cancer Cousin     Social History Social History  Substance Use Topics  . Smoking status: Never Smoker  . Smokeless tobacco: Never Used  . Alcohol use No     Allergies   Topamax [topiramate] and Escitalopram oxalate   Review of Systems Review of Systems  Constitutional: Negative for fever.  Respiratory: Negative for shortness of breath.   Cardiovascular: Negative for chest pain.  Gastrointestinal: Negative for abdominal pain, nausea and vomiting.  Genitourinary: Negative for  dysuria and hematuria.  Musculoskeletal: Positive for back pain. Negative for neck pain.  Neurological: Negative for weakness, numbness and headaches.  All other systems reviewed and are negative.    Physical Exam Updated Vital Signs BP 128/75   Pulse 86   Temp 98.8 F (37.1 C) (Oral)   Resp 18   Ht  (1.753 m)   Wt (!) 154.5 kg   LMP 04/30/2016   SpO2 94%   BMI 50.30 kg/m   Physical Exam  Constitutional: She is oriented to person, place, and time. She appears well-developed and well-nourished.  HENT:  Head: Normocephalic and  atraumatic.  Mouth/Throat: Oropharynx is clear and moist and mucous membranes are normal.  Eyes: Conjunctivae, EOM and lids are normal. Pupils are equal, round, and reactive to light.  Neck: Full passive range of motion without pain.  Good flexion, extension and lateral movement of neck pain. No midline tenderness.  Cardiovascular: Normal rate, regular rhythm, normal heart sounds and normal pulses.  Exam reveals no gallop and no friction rub.   No murmur heard. Pulmonary/Chest: Effort normal and breath sounds normal.  Abdominal: Soft. Normal appearance. There is no tenderness. There is no rigidity and no guarding.  Musculoskeletal: Normal range of motion.       Lumbar back: She exhibits tenderness. She exhibits no bony tenderness.  Diffuse muscular some tenderness to the lumbar region and tenderness to the left and right paraspinal muscles. No midline bony tenderness.  Neurological: She is alert and oriented to person, place, and time.  Follows commands, Moves all extremities  5/5 strength to BUE and BLE  Sensation intact throughout all major nerve distributions. Positive straight leg raise on the left   Skin: Skin is warm and dry. Capillary refill takes less than 2 seconds.  Psychiatric: She has a normal mood and affect. Her speech is normal.  Nursing note and vitals reviewed.    ED Treatments / Results  Labs (all labs ordered are listed, but only abnormal results are displayed) Labs Reviewed  BASIC METABOLIC PANEL - Abnormal; Notable for the following:       Result Value   Potassium 3.2 (*)    Chloride 97 (*)    Glucose, Bld 108 (*)    All other components within normal limits  CBG MONITORING, ED - Abnormal; Notable for the following:    Glucose-Capillary 104 (*)    All other components within normal limits  I-STAT CHEM 8, ED - Abnormal; Notable for the following:    Chloride 96 (*)    Glucose, Bld 112 (*)    Calcium, Ion 1.04 (*)    All other components within normal limits   I-STAT BETA HCG BLOOD, ED ([facility], [facility], [facility] ONLY)    EKG  EKG Interpretation None       Radiology No results found.  Procedures Procedures (including critical care time)  Medications Ordered in ED Medications  ketorolac (TORADOL) 30 MG/ML injection 30 mg (30 mg Intravenous Given 05/09/16 0915)  dexamethasone (DECADRON) injection 10 mg (10 mg Intravenous Given 05/09/16 1005)     Initial Impression / Assessment and Plan / ED Course  I have reviewed the triage vital signs and the nursing notes.  Pertinent labs & imaging results that were available during my care of the patient were reviewed by me and considered in my medical decision making (see chart for details).    46 year old female who presents with 2 days and worsening lower back pain that radiates to her left  lower extremity. Not relieved with Percocet or muscle relaxer use. No red flag symptoms. Physical exam with diffuse muscular tenderness to the lumbar region No neurological deficits on exam. No history of new trauma or injury.  No bowel/bladder incontinence, no numbness/weakness of extremities, no saddle anesthesia. No fever, night sweats or weight loss. No history of cancer. No IVDA. Consider sciatica vs musculoskeletal strain vs mechanical back pain. History/physical are likely indicative of lumbar radiculopathy with some sciatic component. No indications for imaging at this time. Will check, chem 8, beta hCG and blood glucose in the ED department. Explained to Patient that given that she is on a pain management contract and already has prescribed pain medications will not provide any narcotics in the department or prescriptions for narcotics when discharged home.  Labs reviewed. Chem-8 with low calcium. Will repeat BMP. Otherwise stable. Beta-hCG negative. Patient given Toradol and Decadron in the department for symptomatically. Discussed results with patient. She states that she always has low calcium and that this has been a  known problem for many years.  Re-evaluation: Patient reports improvement in his symptoms with Toradol and Decadron given. Stable for discharge at this time. Instructed patient to follow-up with PCP in 2 days. Also instructed her to follow up with her pain management specialist for further evaluation of pain medications. Return precautions discussed. Patient expresses understanding and agreement to plan.      Final Clinical Impressions(s) / ED Diagnoses   Final diagnoses:  Low back pain with left-sided sciatica, unspecified back pain laterality, unspecified chronicity    New Prescriptions New Prescriptions   No medications on file     Maxwell Caul, PA-C 05/09/16 1603    Melene Plan, DO 05/11/16 1125

## 2016-05-09 NOTE — ED Notes (Signed)
Bed: WA01 Expected date:  Expected time:  Means of arrival:  Comments: 

## 2016-05-26 ENCOUNTER — Other Ambulatory Visit: Payer: Self-pay | Admitting: Obstetrics & Gynecology

## 2016-05-26 DIAGNOSIS — N939 Abnormal uterine and vaginal bleeding, unspecified: Secondary | ICD-10-CM

## 2016-06-03 ENCOUNTER — Other Ambulatory Visit: Payer: BLUE CROSS/BLUE SHIELD

## 2016-11-24 ENCOUNTER — Ambulatory Visit
Admission: RE | Admit: 2016-11-24 | Discharge: 2016-11-24 | Disposition: A | Discharge status: Home / Self Care | Payer: BLUE CROSS/BLUE SHIELD | Source: Ambulatory Visit | Attending: Obstetrics & Gynecology | Admitting: Obstetrics & Gynecology

## 2016-11-24 DIAGNOSIS — N939 Abnormal uterine and vaginal bleeding, unspecified: Secondary | ICD-10-CM

## 2016-12-13 ENCOUNTER — Other Ambulatory Visit: Payer: Self-pay | Admitting: Obstetrics & Gynecology

## 2016-12-13 DIAGNOSIS — N83201 Unspecified ovarian cyst, right side: Secondary | ICD-10-CM

## 2016-12-16 ENCOUNTER — Other Ambulatory Visit: Payer: BLUE CROSS/BLUE SHIELD

## 2017-01-14 ENCOUNTER — Other Ambulatory Visit: Payer: BLUE CROSS/BLUE SHIELD

## 2017-01-25 ENCOUNTER — Ambulatory Visit
Admission: RE | Admit: 2017-01-25 | Discharge: 2017-01-25 | Disposition: A | Discharge status: Home / Self Care | Payer: BLUE CROSS/BLUE SHIELD | Source: Ambulatory Visit | Attending: Obstetrics & Gynecology | Admitting: Obstetrics & Gynecology

## 2017-01-25 DIAGNOSIS — N83201 Unspecified ovarian cyst, right side: Secondary | ICD-10-CM

## 2017-05-04 ENCOUNTER — Ambulatory Visit (HOSPITAL_COMMUNITY)
Admission: EM | Admit: 2017-05-04 | Discharge: 2017-05-04 | Disposition: A | Discharge status: Home / Self Care | Payer: BLUE CROSS/BLUE SHIELD | Attending: Family Medicine | Admitting: Family Medicine

## 2017-05-04 ENCOUNTER — Encounter (HOSPITAL_COMMUNITY): Payer: Self-pay | Admitting: Family Medicine

## 2017-05-04 DIAGNOSIS — B349 Viral infection, unspecified: Secondary | ICD-10-CM

## 2017-05-04 MED ORDER — CETIRIZINE HCL 10 MG PO TABS: 10.0000 mg | ORAL_TABLET | Freq: Every day | ORAL | 0 refills | Status: DC

## 2017-05-04 MED ORDER — FLUTICASONE PROPIONATE 50 MCG/ACT NA SUSP
2.0000 | Freq: Every day | NASAL | 0 refills | Status: DC
Start: 2017-05-04 — End: 2019-03-28

## 2017-05-04 MED ORDER — BENZONATATE 100 MG PO CAPS: 100.0000 mg | ORAL_CAPSULE | Freq: Three times a day (TID) | ORAL | 0 refills | Status: DC

## 2017-05-04 MED ORDER — IPRATROPIUM BROMIDE 0.06 % NA SOLN: 2.0000 | Freq: Four times a day (QID) | NASAL | 0 refills | Status: DC

## 2017-05-04 NOTE — ED Provider Notes (Signed)
[facility]    CSN: [id number] Arrival date & time: 05/04/17  1900     History   Chief Complaint Chief Complaint  Patient presents with  . Ear Fullness    HPI [name] is a 47 y.o. female.   47 year old female comes in for 2-day history of URI symptoms.  Has had productive cough, rhinorrhea, nasal congestion, sore throat, ear fullness.  Subjective fever 2 days ago that has since resolved with ibuprofen.  Start taking anything else for symptoms.  Never smoker.     Past Medical History:  Diagnosis Date  . DDD (degenerative disc disease), lumbar   . Hypertension     Patient Active Problem List   Diagnosis Date Noted  . ANEMIA NEC 07/06/2006  . HYPERTENSION, BENIGN ESSENTIAL 07/06/2006  . DEGENERATIVE JOINT DISEASE 07/06/2006  . OSTEOARTHROSIS NOS, LOWER LEG 07/06/2006    Past Surgical History:  Procedure Laterality Date  . FOOT FRACTURE SURGERY Right 2012  . KNEE SURGERY  1998  . TUBAL LIGATION  1999    OB History   None      Home Medications    Prior to Admission medications   Medication Sig Start Date End Date Taking? Authorizing Provider  benzonatate (TESSALON) 100 MG capsule Take 1 capsule (100 mg total) by mouth every 8 (eight) hours. 05/04/17   Cathie HoopsYu, Gillermo Poch V, PA-C  cetirizine (ZYRTEC) 10 MG tablet Take 1 tablet (10 mg total) by mouth daily. 05/04/17   Cathie HoopsYu, Calbert Hulsebus V, PA-C  docusate sodium (COLACE) 100 MG capsule Take 100 mg by mouth 2 (two) times daily.    [provider]  fluticasone (FLONASE) 50 MCG/ACT nasal spray Place 2 sprays into both nostrils daily. 05/04/17   Cathie HoopsYu, Amro Winebarger V, PA-C  hydrochlorothiazide (HYDRODIURIL) 25 MG tablet Take 25 tablets by mouth daily. 03/25/16   [provider]  ibuprofen (ADVIL,MOTRIN) 200 MG tablet Take 800 mg by mouth every 6 (six) hours as needed.    [provider]  ipratropium (ATROVENT) 0.06 % nasal spray Place 2 sprays into both nostrils 4 (four) times daily. 05/04/17   Belinda FisherYu, Keera Altidor V, PA-C    oxyCODONE-acetaminophen (PERCOCET) 7.5-325 MG tablet Take 1 tablet by mouth 3 (three) times daily. 03/15/16   [provider]  triamcinolone cream (KENALOG) 0.1 % Apply 1 application topically daily. 05/06/16   [provider]  Vitamin D, Ergocalciferol, (DRISDOL) 50000 units CAPS capsule Take 50,000 Units by mouth once a week. Take once weekly on saturday 03/15/16   [provider]    Family History Family History  Problem Relation Age of Onset  . Kidney failure Mother   . Hypertension Mother   . Diabetes Mother   . Hypertension Sister   . Diabetes Sister   . Cancer Sister   . Cancer Cousin     Social History Social History   Tobacco Use  . Smoking status: Never Smoker  . Smokeless tobacco: Never Used  Substance Use Topics  . Alcohol use: No  . Drug use: No     Allergies   Topamax [topiramate] and Escitalopram oxalate   Review of Systems Review of Systems  Reason unable to perform ROS: See HPI as above.     Physical Exam Triage Vital Signs ED Triage Vitals  Enc Vitals Group     BP 05/04/17 1948 136/75     Pulse Rate 05/04/17 1948 100     Resp 05/04/17 1948 18     Temp 05/04/17 1948 98.4  F (36.9 C)     Temp src --      SpO2 05/04/17 1948 100 %     Weight --      Height --      Head Circumference --      Peak Flow --      Pain Score 05/04/17 1946 5     Pain Loc --      Pain Edu? --      Excl. in GC? --    No data found.  Updated Vital Signs BP 136/75   Pulse 100   Temp 98.4 F (36.9 C)   Resp 18   SpO2 100%   Physical Exam  Constitutional: She is oriented to person, place, and time. She appears well-developed and well-nourished. No distress.  HENT:  Head: Normocephalic and atraumatic.  Right Ear: Tympanic membrane, external ear and ear canal normal. Tympanic membrane is not erythematous and not bulging.  Left Ear: Tympanic membrane, external ear and ear canal normal. Tympanic membrane is not erythematous and not  bulging.  Nose: Mucosal edema and rhinorrhea present. Right sinus exhibits maxillary sinus tenderness and frontal sinus tenderness. Left sinus exhibits maxillary sinus tenderness and frontal sinus tenderness.  Mouth/Throat: Uvula is midline, oropharynx is clear and moist and mucous membranes are normal. No tonsillar exudate.  Eyes: Pupils are equal, round, and reactive to light. Conjunctivae are normal.  Neck: Normal range of motion. Neck supple.  Cardiovascular: Normal rate, regular rhythm and normal heart sounds. Exam reveals no gallop and no friction rub.  No murmur heard. Pulmonary/Chest: Effort normal and breath sounds normal. She has no decreased breath sounds. She has no wheezes. She has no rhonchi. She has no rales.  Lymphadenopathy:    She has no cervical adenopathy.  Neurological: She is alert and oriented to person, place, and time.  Skin: Skin is warm and dry.  Psychiatric: She has a normal mood and affect. Her behavior is normal. Judgment normal.     UC Treatments / Results  Labs (all labs ordered are listed, but only abnormal results are displayed) Labs Reviewed - No data to display  EKG None Radiology No results found.  Procedures Procedures (including critical care time)  Medications Ordered in UC Medications - No data to display   Initial Impression / Assessment and Plan / UC Course  I have reviewed the triage vital signs and the nursing notes.  Pertinent labs & imaging results that were available during my care of the patient were reviewed by me and considered in my medical decision making (see chart for details).    Discussed with patient history and exam most consistent with viral URI. Symptomatic treatment as needed. Push fluids. Return precautions given.   Final Clinical Impressions(s) / UC Diagnoses   Final diagnoses:  Viral illness    ED Discharge Orders        Ordered    cetirizine (ZYRTEC) 10 MG tablet  Daily     05/04/17 2031     fluticasone (FLONASE) 50 MCG/ACT nasal spray  Daily     05/04/17 2031    ipratropium (ATROVENT) 0.06 % nasal spray  4 times daily     05/04/17 2031    benzonatate (TESSALON) 100 MG capsule  Every 8 hours     05/04/17 2031        Belinda Fisher, PA-C 05/04/17 2033

## 2017-05-04 NOTE — ED Triage Notes (Signed)
Pt here for cough, drainage, sore throat.

## 2017-05-04 NOTE — Discharge Instructions (Signed)
Tessalon for cough. Start flonase, atrovent nasal spray, zyrtec for nasal congestion/drainage. You can use over the counter nasal saline rinse such as neti pot for nasal congestion. Keep hydrated, your urine should be clear to pale yellow in color. Tylenol/motrin for fever and pain. Monitor for any worsening of symptoms, chest pain, shortness of breath, wheezing, swelling of the throat, follow up for reevaluation.  ° °For sore throat try using a honey-based tea. Use 3 teaspoons of honey with juice squeezed from half lemon. Place shaved pieces of ginger into 1/2-1 cup of water and warm over stove top. Then mix the ingredients and repeat every 4 hours as needed. ° °

## 2017-05-08 ENCOUNTER — Ambulatory Visit (HOSPITAL_COMMUNITY)
Admission: EM | Admit: 2017-05-08 | Discharge: 2017-05-08 | Disposition: A | Discharge status: Home / Self Care | Payer: BLUE CROSS/BLUE SHIELD

## 2017-05-08 ENCOUNTER — Other Ambulatory Visit: Payer: Self-pay

## 2017-05-08 ENCOUNTER — Encounter (HOSPITAL_COMMUNITY): Payer: Self-pay | Admitting: Emergency Medicine

## 2017-05-08 DIAGNOSIS — B349 Viral infection, unspecified: Secondary | ICD-10-CM | POA: Diagnosis not present

## 2017-05-08 NOTE — ED Triage Notes (Signed)
States is not better from last OV, c/o bilateral ear pain, feeling thirsty, rhinitis with productive cough "green" mucus

## 2017-05-08 NOTE — Discharge Instructions (Signed)
Continue your home regimen.

## 2017-05-08 NOTE — ED Notes (Signed)
Patient discharged by provider.

## 2017-05-08 NOTE — ED Provider Notes (Signed)
05/08/2017 12:35 PM   DOB: 01/28/1970 / MRN: [id number]  SUBJECTIVE:  [name] is a 47 y.o. female presenting for continued left ear symptoms, cough, sore throat.  Patient is on day 7 of illness told him was here 3 days ago and advised that she had a viral illness.  She tells me that the cough is improving because she is now coughing things up.  [place] is going to the [place] on [place] and is concerned that she will still be sick.  She denies fever.  She is allergic to topamax [topiramate] and escitalopram oxalate.   She  has a past medical history of DDD (degenerative disc disease), lumbar and Hypertension.    She  reports that she has never smoked. She has never used smokeless tobacco. She reports that she does not drink alcohol or use drugs. She  reports that she currently engages in sexual activity. She reports using the following method of birth control/protection: Surgical. The patient  has a past surgical history that includes Tubal ligation (1999); Knee surgery (1998); and Foot fracture surgery (Right, 2012).  Her family history includes Cancer in her cousin and sister; Diabetes in her mother and sister; Hypertension in her mother and sister; Kidney failure in her mother.  ROS per HPI  OBJECTIVE:  BP (!) 159/93 (BP Location: Left Wrist)   Pulse (!) 110   Temp 98.1 F (36.7 C) (Oral)   SpO2 100%  Pulse 96 on recheck by me via auscultation with a 15-second count.  Pulse Readings from Last 3 Encounters:  05/08/17 (!) 110  05/04/17 100  05/09/16 86     Physical Exam  Constitutional: She is oriented to person, place, and time. She appears well-nourished. No distress.  HENT:  Right Ear: External ear normal.  Left Ear: External ear normal.  Nose: Mucosal edema present. Right sinus exhibits no maxillary sinus tenderness and no frontal sinus tenderness. Left sinus exhibits no maxillary sinus tenderness and no frontal sinus tenderness.  Mouth/Throat: Oropharynx is clear and  moist. No oropharyngeal exudate.  Eyes: Pupils are equal, round, and reactive to light. Conjunctivae and EOM are normal.  Cardiovascular: Normal rate, regular rhythm and normal heart sounds.  Pulmonary/Chest: Effort normal and breath sounds normal. She has no rales.  Abdominal: She exhibits no distension.  Neurological: She is alert and oriented to person, place, and time. No cranial nerve deficit. Gait normal.  Skin: Skin is warm and dry. No rash noted. She is not diaphoretic. No erythema.  Psychiatric: She has a normal mood and affect. Her behavior is normal.  Vitals reviewed.   No results found for this or any previous visit (from the past 72 hour(s)).  No results found.  ASSESSMENT AND PLAN:  No orders of the defined types were placed in this encounter.    Viral illness - There are no exam findings to suggest an ear infection or strep throat.  The patient's cough is improving which further strengthens the notion that this is a viral infection.  I have advised her against antibiotics at this time and while upset and frustrated she understands given the risk benefit ratio.      The patient is advised to call or return to clinic if she does not see an improvement in symptoms, or to seek the care of the closest emergency department if she worsens with the above plan.   Deliah BostonMichael Simra Fiebig, MHS, PA-C 05/08/2017 12:35 PM    Ofilia Neaslark, Artis Beggs L, PA-C 05/08/17 1235

## 2017-10-07 ENCOUNTER — Other Ambulatory Visit: Payer: Self-pay | Admitting: Family Medicine

## 2017-10-07 DIAGNOSIS — Z1231 Encounter for screening mammogram for malignant neoplasm of breast: Secondary | ICD-10-CM

## 2017-11-04 ENCOUNTER — Other Ambulatory Visit: Payer: Self-pay | Admitting: Rehabilitation

## 2017-11-04 DIAGNOSIS — M545 Low back pain, unspecified: Secondary | ICD-10-CM

## 2017-11-09 ENCOUNTER — Ambulatory Visit: Payer: BLUE CROSS/BLUE SHIELD

## 2018-01-05 ENCOUNTER — Ambulatory Visit: Payer: BC Managed Care – PPO

## 2018-02-10 ENCOUNTER — Other Ambulatory Visit: Payer: Self-pay | Admitting: Family Medicine

## 2018-02-10 DIAGNOSIS — R1013 Epigastric pain: Secondary | ICD-10-CM

## 2018-02-16 ENCOUNTER — Other Ambulatory Visit: Payer: BC Managed Care – PPO

## 2018-03-03 ENCOUNTER — Other Ambulatory Visit: Payer: BC Managed Care – PPO

## 2018-03-03 ENCOUNTER — Ambulatory Visit
Admission: RE | Admit: 2018-03-03 | Discharge: 2018-03-03 | Disposition: A | Discharge status: Home / Self Care | Payer: BC Managed Care – PPO | Source: Ambulatory Visit | Attending: Family Medicine | Admitting: Family Medicine

## 2018-03-03 DIAGNOSIS — R1013 Epigastric pain: Secondary | ICD-10-CM

## 2018-03-08 ENCOUNTER — Other Ambulatory Visit: Payer: Self-pay | Admitting: Family Medicine

## 2018-03-08 DIAGNOSIS — R109 Unspecified abdominal pain: Secondary | ICD-10-CM

## 2018-03-08 DIAGNOSIS — R1013 Epigastric pain: Secondary | ICD-10-CM

## 2018-03-08 DIAGNOSIS — R10A0 Flank pain, unspecified side: Secondary | ICD-10-CM

## 2018-03-20 ENCOUNTER — Other Ambulatory Visit: Payer: BC Managed Care – PPO

## 2018-03-29 ENCOUNTER — Ambulatory Visit
Admission: RE | Admit: 2018-03-29 | Discharge: 2018-03-29 | Disposition: A | Discharge status: Home / Self Care | Payer: BC Managed Care – PPO | Source: Ambulatory Visit | Attending: Family Medicine | Admitting: Family Medicine

## 2018-03-29 DIAGNOSIS — R10A0 Flank pain, unspecified side: Secondary | ICD-10-CM

## 2018-03-29 DIAGNOSIS — R1013 Epigastric pain: Secondary | ICD-10-CM

## 2018-03-29 DIAGNOSIS — R109 Unspecified abdominal pain: Secondary | ICD-10-CM

## 2018-03-29 MED ORDER — IOPAMIDOL (ISOVUE-300) INJECTION 61%
125.0000 mL | Freq: Once | INTRAVENOUS | Status: AC | PRN
  Administered 2018-03-29: 125 mL via INTRAVENOUS

## 2019-01-20 ENCOUNTER — Encounter (HOSPITAL_BASED_OUTPATIENT_CLINIC_OR_DEPARTMENT_OTHER): Payer: Self-pay | Admitting: Emergency Medicine

## 2019-01-20 ENCOUNTER — Other Ambulatory Visit: Payer: Self-pay

## 2019-01-20 ENCOUNTER — Emergency Department (HOSPITAL_BASED_OUTPATIENT_CLINIC_OR_DEPARTMENT_OTHER)
Admission: EM | Admit: 2019-01-20 | Discharge: 2019-01-21 | Disposition: A | Discharge status: Home / Self Care | Payer: BC Managed Care – PPO | Attending: Emergency Medicine | Admitting: Emergency Medicine

## 2019-01-20 DIAGNOSIS — Z5321 Procedure and treatment not carried out due to patient leaving prior to being seen by health care provider: Secondary | ICD-10-CM | POA: Diagnosis not present

## 2019-01-20 DIAGNOSIS — R0789 Other chest pain: Secondary | ICD-10-CM | POA: Insufficient documentation

## 2019-01-20 LAB — CBC
HCT: 40.5 % (ref 36.0–46.0)
Hemoglobin: 12.9 g/dL (ref 12.0–15.0)
MCH: 27.2 pg (ref 26.0–34.0)
MCHC: 31.9 g/dL (ref 30.0–36.0)
MCV: 85.3 fL (ref 80.0–100.0)
Platelets: 201 10*3/uL (ref 150–400)
RBC: 4.75 MIL/uL (ref 3.87–5.11)
RDW: 12.8 % (ref 11.5–15.5)
WBC: 11.4 10*3/uL — ABNORMAL HIGH (ref 4.0–10.5)
nRBC: 0 % (ref 0.0–0.2)

## 2019-01-20 LAB — BASIC METABOLIC PANEL
Anion gap: 9 (ref 5–15)
BUN: 10 mg/dL (ref 6–20)
CO2: 24 mmol/L (ref 22–32)
Calcium: 9.2 mg/dL (ref 8.9–10.3)
Chloride: 105 mmol/L (ref 98–111)
Creatinine, Ser: 0.79 mg/dL (ref 0.44–1.00)
GFR calc Af Amer: >60 mL/min (ref >60)
GFR calc non Af Amer: >60 mL/min (ref >60)
Glucose, Bld: 102 mg/dL — ABNORMAL HIGH (ref 70–99)
Potassium: 3.7 mmol/L (ref 3.5–5.1)
Sodium: 138 mmol/L (ref 135–145)

## 2019-01-20 LAB — TROPONIN I (HIGH SENSITIVITY): Troponin I (High Sensitivity): <2 ng/L (ref <18)

## 2019-01-20 NOTE — ED Triage Notes (Signed)
C/o R side chest and arm pain since this morning. States "It feels like someone is walking on my chest with high heel shoes."

## 2019-03-28 ENCOUNTER — Encounter (INDEPENDENT_AMBULATORY_CARE_PROVIDER_SITE_OTHER): Payer: Self-pay | Admitting: Bariatrics

## 2019-03-28 ENCOUNTER — Other Ambulatory Visit: Payer: Self-pay

## 2019-03-28 ENCOUNTER — Ambulatory Visit (INDEPENDENT_AMBULATORY_CARE_PROVIDER_SITE_OTHER): Payer: BC Managed Care – PPO | Admitting: Bariatrics

## 2019-03-28 VITALS — BP 124/84 | HR 82 | Temp 98.3°F | Ht 67.0 in | Wt 368.0 lb

## 2019-03-28 DIAGNOSIS — M545 Low back pain, unspecified: Secondary | ICD-10-CM

## 2019-03-28 DIAGNOSIS — R5383 Other fatigue: Secondary | ICD-10-CM | POA: Diagnosis not present

## 2019-03-28 DIAGNOSIS — Z9189 Other specified personal risk factors, not elsewhere classified: Secondary | ICD-10-CM | POA: Diagnosis not present

## 2019-03-28 DIAGNOSIS — G43009 Migraine without aura, not intractable, without status migrainosus: Secondary | ICD-10-CM

## 2019-03-28 DIAGNOSIS — I10 Essential (primary) hypertension: Secondary | ICD-10-CM | POA: Diagnosis not present

## 2019-03-28 DIAGNOSIS — M47816 Spondylosis without myelopathy or radiculopathy, lumbar region: Secondary | ICD-10-CM | POA: Diagnosis not present

## 2019-03-28 DIAGNOSIS — E66813 Obesity, class 3: Secondary | ICD-10-CM

## 2019-03-28 DIAGNOSIS — R0602 Shortness of breath: Secondary | ICD-10-CM | POA: Diagnosis not present

## 2019-03-28 DIAGNOSIS — Z1331 Encounter for screening for depression: Secondary | ICD-10-CM

## 2019-03-28 DIAGNOSIS — Z0289 Encounter for other administrative examinations: Secondary | ICD-10-CM

## 2019-03-28 DIAGNOSIS — D508 Other iron deficiency anemias: Secondary | ICD-10-CM

## 2019-03-28 DIAGNOSIS — E559 Vitamin D deficiency, unspecified: Secondary | ICD-10-CM

## 2019-03-28 DIAGNOSIS — Z6841 Body Mass Index (BMI) 40.0 and over, adult: Secondary | ICD-10-CM

## 2019-03-28 DIAGNOSIS — R7309 Other abnormal glucose: Secondary | ICD-10-CM

## 2019-03-28 NOTE — Progress Notes (Signed)
Chief Complaint:   OBESITY [name] (MR# [id number]) is a 49 y.o. female who presents for evaluation and treatment of obesity and related comorbidities. Current BMI is Body mass index is 57.64 kg/m.Marland Kitchen [name] has been struggling with her weight for many years and has been unsuccessful in either losing weight, maintaining weight loss, or reaching her healthy weight goal.  [name] is currently in the action stage of change and ready to dedicate time achieving and maintaining a healthier weight. [name] is interested in becoming our patient and working on intensive lifestyle modifications including (but not limited to) diet and exercise for weight loss.  [name] likes to [employer] and denies obstacles to cooking. She dislikes peas, dairy, milk, and liver. She states that she is a "picky eater." She skips meals.  [name]'s habits were reviewed today and are as follows: she thinks her family will eat healthier with her, her desired weight loss is 118-168 lbs, she started gaining weight in 2001, her heaviest weight ever was 368 pounds, she is a picky eater and doesn't like to eat healthier foods, she craves chicken, she snacks frequently in the evenings, she skips meals frequently, she is trying to follow a vegetarian diet, she frequently makes poor food choices, she sometimes eats larger portions than normal and she struggles with emotional eating.  Depression Screen [name]'s Food and Mood (modified PHQ-9) score was 9.  Depression screen PHQ 2/9 03/28/2019  Decreased Interest 3  Down, Depressed, Hopeless 2  PHQ - 2 Score 5  Altered sleeping 1  Tired, decreased energy 2  Change in appetite 1  Feeling bad or failure about yourself  0  Trouble concentrating 0  Moving slowly or fidgety/restless 0  Suicidal thoughts 0  PHQ-9 Score 9  Difficult doing work/chores Very difficult   Subjective:   Other fatigue. [name] denies daytime somnolence and denies waking up still tired.  [name] generally gets 4-5 hours of sleep per night, and states that she does not sleep well most nights. Snoring is sometimes present. Apneic episodes are not present. Epworth Sleepiness Score is 2.  SOB (shortness of breath) on exertion. [name] notes increasing shortness of breath with certain activities and seems to be worsening over time with weight gain. She notes getting out of breath sooner with activity than she used to. This has gotten worse recently. [name] denies shortness of breath at rest or orthopnea.  Essential hypertension. [name] is taking HCTZ. Blood pressure is well controlled with medication.  BP Readings from Last 3 Encounters:  03/28/19 124/84  01/20/19 (!) 165/106  05/08/17 (!) 159/93   Lab Results  Component Value Date   CREATININE 0.79 01/20/2019   CREATININE 0.78 05/09/2016   CREATININE 0.80 05/09/2016   Spondylosis of lumbar region without myelopathy or radiculopathy. [name] is taking Meloxicam.   Low back pain without sciatica, unspecified back pain laterality, unspecified chronicity. [name] is taking Meloxicam. Low back pain limits her ability to exercise.  Migraine without aura and without status migrainosus, not intractable. [name] is on no medications. She reports migraines are improving.  Vitamin D deficiency. [name] is taking high dose Vitamin D.  Elevated glucose. [name] has a history of some elevated blood glucose readings without a diagnosis of diabetes.  Other iron deficiency anemia. [name] is not on iron supplementation at this time.  CBC Latest Ref Rng & Units 01/20/2019 05/09/2016 08/05/2010  WBC 4.0 - 10.5 K/uL 11.4(H) - -  Hemoglobin 12.0 - 15.0 g/dL 86.7 67.2 09.4  Hematocrit  36.0 - 46.0 % 40.5 38.0 -  Platelets 150 - 400 K/uL 201 - -   Lab Results  Component Value Date   IRON 32 (L) 06/09/2010   TIBC 279 06/09/2010   FERRITIN 23 06/09/2010   Lab Results  Component Value Date   VITAMINB12 227 12/15/2009    Depression screening. Elsa had a mildly positive depression screen with a PHQ-9 score of 9.  At risk for activity intolerance. [place] is at risk of exercise intolerance due to DJD.  Assessment/Plan:   Other fatigue. Michi does feel that her weight is causing her energy to be lower than it should be. Fatigue may be related to obesity, depression or many other causes. Labs will be ordered, and in the meanwhile, Vickey will focus on self care including making healthy food choices, increasing physical activity and focusing on stress reduction. EKG 12-Lead, T3, T4, free, TSH ordered.  SOB (shortness of breath) on exertion. [name] does feel that she gets out of breath more easily that she used to when she exercises. [name]'s shortness of breath appears to be obesity related and exercise induced. She has agreed to work on weight loss and gradually increase exercise to treat her exercise induced shortness of breath. Will continue to monitor closely. T3, T4, free, TSH ordered.  Essential hypertension. [name] is working on healthy weight loss and exercise to improve blood pressure control. We will watch for signs of hypotension as she continues her lifestyle modifications. She will continue her medications as directed. Comprehensive metabolic panel, CBC with Differential/Platelet labs ordered.  Spondylosis of lumbar region without myelopathy or radiculopathy. [name] will continue her Meloxicam as directed.  Low back pain without sciatica, unspecified back pain laterality, unspecified chronicity. [name] will continue her medication as directed.  Migraine without aura and without status migrainosus, not intractable. [name] will follow-up with her PCP. We discussed that migraines may decrease in frequency or decrease intensity of migraines.  Vitamin D deficiency. Low Vitamin D level contributes to fatigue and are associated with obesity, breast, and colon cancer. VITAMIN D 25 Hydroxy  (Vit-D Deficiency, Fractures) level ordered.  Elevated glucose. Fasting labs will be obtained and results with be discussed with [name] in 2 weeks at her follow up visit. In the meanwhile [name] was started on a lower simple carbohydrate diet and will work on weight loss efforts. Hemoglobin A1c, Insulin, random ordered.  Other iron deficiency anemia. Orders and follow up as documented in patient record. Will check CBC.  Counseling . Iron is essential for our bodies to make red blood cells.  Reasons that someone may be deficient include: an iron-deficient diet (more likely in those following vegan or vegetarian diets), women with heavy menses, patients with GI disorders or poor absorption, patients that have had bariatric surgery, frequent blood donors, patients with cancer, and patients with heart disease.   Marland Kitchen An iron supplement has been recommended. This is found over-the-counter.  Gaspar Cola foods include dark leafy greens, red and white meats, eggs, seafood, and beans.   . Certain foods and drinks prevent your body from absorbing iron properly. Avoid eating these foods in the same meal as iron-rich foods or with iron supplements. These foods include: coffee, black tea, and red wine; milk, dairy products, and foods that are high in calcium; beans and soybeans; whole grains.  . Constipation can be a side effect of iron supplementation. Increased water and fiber intake are helpful. Water goal: > 2 liters/day. Fiber goal: > 25 grams/day.  Depression screening.  [name] had a positive depression screening. Depression is commonly associated with obesity and often results in emotional eating behaviors. We will monitor this closely and work on CBT to help improve the non-hunger eating patterns. Referral to Psychology may be required if no improvement is seen as she continues in our clinic.  At risk for activity intolerance. [name] was given approximately 15 minutes of exercise intolerance counseling  today. She is 49 y.o. female and has risk factors exercise intolerance including obesity. We discussed intensive lifestyle modifications today with an emphasis on specific weight loss instructions and strategies. [name] will slowly increase activity as tolerated.  Repetitive spaced learning was employed today to elicit superior memory formation and behavioral change.  Class 3 severe obesity with serious comorbidity and body mass index (BMI) of 50.0 to 59.9 in adult, unspecified obesity type ([place]).  [name] is currently in the action stage of change and her goal is to continue with weight loss efforts. I recommend Jaselynn begin the structured treatment plan as follows:  She has agreed to the Category 4 Plan.  She will work on meal planning, mindless eating, and will stop all sugary drinks.  We independently reviewed with the patient lab results including CMP, CBC, and glucose.  Exercise goals: All adults should avoid inactivity. Some physical activity is better than none, and adults who participate in any amount of physical activity gain some health benefits.   Behavioral modification strategies: increasing lean protein intake, decreasing simple carbohydrates, increasing vegetables, increasing water intake, decreasing eating out, no skipping meals, meal planning and cooking strategies, keeping healthy foods in the home and planning for success.  She was informed of the importance of frequent follow-up visits to maximize her success with intensive lifestyle modifications for her multiple health conditions. She was informed we would discuss her lab results at her next visit unless there is a critical issue that needs to be addressed sooner. Grete agreed to keep her next visit at the agreed upon time to discuss these results.  Objective:   Blood pressure 124/84, pulse 82, temperature 98.3 F (36.8 C), height 5\' 7"  (1.702 m), weight (!) 368 lb (166.9 kg), last menstrual period 03/19/2019,  SpO2 100 %. Body mass index is 57.64 kg/m.  EKG: Sinus  Rhythm with a rate of 83 BPM. Poor R-wave progression - nonspecific - may be related to  body habitus. Diffuse nonspecific T-abnormality. Otherwise normal.   Indirect Calorimeter completed today shows a VO2 of 412 and a REE of 2864.  Her calculated basal metabolic rate is 0263 thus her basal metabolic rate is better than expected.  General: Cooperative, alert, well developed, in no acute distress. HEENT: Conjunctivae and lids unremarkable. Cardiovascular: Regular rhythm.  Lungs: Normal work of breathing. Neurologic: No focal deficits.   Lab Results  Component Value Date   CREATININE 0.79 01/20/2019   BUN 10 01/20/2019   NA 138 01/20/2019   K 3.7 01/20/2019   CL 105 01/20/2019   CO2 24 01/20/2019   Lab Results  Component Value Date   ALT 10 06/09/2010   AST 16 06/09/2010   ALKPHOS 65 06/09/2010   BILITOT 0.7 06/09/2010   No results found for: HGBA1C No results found for: INSULIN Lab Results  Component Value Date   TSH 1.856 10/23/2008   Lab Results  Component Value Date   CHOL 133 10/23/2008   HDL 49 10/23/2008   LDLCALC 71 10/23/2008   TRIG 65 10/23/2008   CHOLHDL 2.7 Ratio 10/23/2008   Lab Results  Component Value Date   WBC 11.4 (H) 01/20/2019   HGB 12.9 01/20/2019   HCT 40.5 01/20/2019   MCV 85.3 01/20/2019   PLT 201 01/20/2019   Lab Results  Component Value Date   IRON 32 (L) 06/09/2010   TIBC 279 06/09/2010   FERRITIN 23 06/09/2010   Attestation Statements:   Reviewed by clinician on day of visit: allergies, medications, problem list, medical history, surgical history, family history, social history, and previous encounter notes.  Fernanda Drum, am acting as Energy manager for [employer], DO   I have reviewed the above documentation for accuracy and completeness, and I agree with the above. Corinna Capra, DO

## 2019-03-29 ENCOUNTER — Encounter (INDEPENDENT_AMBULATORY_CARE_PROVIDER_SITE_OTHER): Payer: Self-pay | Admitting: Bariatrics

## 2019-03-29 DIAGNOSIS — E559 Vitamin D deficiency, unspecified: Secondary | ICD-10-CM | POA: Insufficient documentation

## 2019-03-29 DIAGNOSIS — E876 Hypokalemia: Secondary | ICD-10-CM | POA: Insufficient documentation

## 2019-03-29 LAB — COMPREHENSIVE METABOLIC PANEL
ALT: 12 IU/L (ref 0–32)
AST: 14 IU/L (ref 0–40)
Albumin/Globulin Ratio: 1.3 (ref 1.2–2.2)
Albumin: 4 g/dL (ref 3.8–4.8)
Alkaline Phosphatase: 101 IU/L (ref 39–117)
BUN/Creatinine Ratio: 14 (ref 9–23)
BUN: 10 mg/dL (ref 6–24)
Bilirubin Total: 0.7 mg/dL (ref 0.0–1.2)
CO2: 28 mmol/L (ref 20–29)
Calcium: 9.1 mg/dL (ref 8.7–10.2)
Chloride: 98 mmol/L (ref 96–106)
Creatinine, Ser: 0.72 mg/dL (ref 0.57–1.00)
GFR calc Af Amer: 115 mL/min/{1.73_m2} (ref >59)
GFR calc non Af Amer: 99 mL/min/{1.73_m2} (ref >59)
Globulin, Total: 3.1 g/dL (ref 1.5–4.5)
Glucose: 123 mg/dL — ABNORMAL HIGH (ref 65–99)
Potassium: 3.4 mmol/L — ABNORMAL LOW (ref 3.5–5.2)
Sodium: 140 mmol/L (ref 134–144)
Total Protein: 7.1 g/dL (ref 6.0–8.5)

## 2019-03-29 LAB — VITAMIN D 25 HYDROXY (VIT D DEFICIENCY, FRACTURES): Vit D, 25-Hydroxy: 25.2 ng/mL — ABNORMAL LOW (ref 30.0–100.0)

## 2019-03-29 LAB — CBC WITH DIFFERENTIAL/PLATELET
Basophils Absolute: 0.1 10*3/uL (ref 0.0–0.2)
Basos: 1 %
EOS (ABSOLUTE): 0.2 10*3/uL (ref 0.0–0.4)
Eos: 2 %
Hematocrit: 39.5 % (ref 34.0–46.6)
Hemoglobin: 13.2 g/dL (ref 11.1–15.9)
Immature Grans (Abs): 0 10*3/uL (ref 0.0–0.1)
Immature Granulocytes: 0 %
Lymphocytes Absolute: 1.9 10*3/uL (ref 0.7–3.1)
Lymphs: 20 %
MCH: 27.4 pg (ref 26.6–33.0)
MCHC: 33.4 g/dL (ref 31.5–35.7)
MCV: 82 fL (ref 79–97)
Monocytes Absolute: 0.5 10*3/uL (ref 0.1–0.9)
Monocytes: 6 %
Neutrophils Absolute: 6.6 10*3/uL (ref 1.4–7.0)
Neutrophils: 71 %
Platelets: 216 10*3/uL (ref 150–450)
RBC: 4.82 x10E6/uL (ref 3.77–5.28)
RDW: 13 % (ref 11.7–15.4)
WBC: 9.2 10*3/uL (ref 3.4–10.8)

## 2019-03-29 LAB — HEMOGLOBIN A1C
Est. average glucose Bld gHb Est-mCnc: 108 mg/dL
Hgb A1c MFr Bld: 5.4 % (ref 4.8–5.6)

## 2019-03-29 LAB — T3: T3, Total: 121 ng/dL (ref 71–180)

## 2019-03-29 LAB — INSULIN, RANDOM: INSULIN: 54.8 u[IU]/mL — ABNORMAL HIGH (ref 2.6–24.9)

## 2019-03-29 LAB — TSH: TSH: 1.43 u[IU]/mL (ref 0.450–4.500)

## 2019-03-29 LAB — T4, FREE: Free T4: 1.16 ng/dL (ref 0.82–1.77)

## 2019-04-11 ENCOUNTER — Encounter (INDEPENDENT_AMBULATORY_CARE_PROVIDER_SITE_OTHER): Payer: Self-pay | Admitting: Bariatrics

## 2019-04-11 ENCOUNTER — Ambulatory Visit (INDEPENDENT_AMBULATORY_CARE_PROVIDER_SITE_OTHER): Payer: BC Managed Care – PPO | Admitting: Bariatrics

## 2019-04-11 ENCOUNTER — Other Ambulatory Visit: Payer: Self-pay

## 2019-04-11 VITALS — BP 137/80 | HR 85 | Temp 98.0°F

## 2019-04-11 DIAGNOSIS — E876 Hypokalemia: Secondary | ICD-10-CM

## 2019-04-11 DIAGNOSIS — Z9189 Other specified personal risk factors, not elsewhere classified: Secondary | ICD-10-CM

## 2019-04-11 DIAGNOSIS — E8881 Metabolic syndrome: Secondary | ICD-10-CM | POA: Diagnosis not present

## 2019-04-11 DIAGNOSIS — E559 Vitamin D deficiency, unspecified: Secondary | ICD-10-CM | POA: Diagnosis not present

## 2019-04-11 DIAGNOSIS — Z6841 Body Mass Index (BMI) 40.0 and over, adult: Secondary | ICD-10-CM

## 2019-04-11 DIAGNOSIS — F3289 Other specified depressive episodes: Secondary | ICD-10-CM

## 2019-04-11 DIAGNOSIS — I10 Essential (primary) hypertension: Secondary | ICD-10-CM | POA: Diagnosis not present

## 2019-04-11 MED ORDER — POTASSIUM CHLORIDE ER 10 MEQ PO TBCR: 10.0000 meq | EXTENDED_RELEASE_TABLET | Freq: Every day | ORAL | 0 refills | Status: DC

## 2019-04-11 MED ORDER — CHOLECALCIFEROL 1.25 MG (50000 UT) PO TABS: ORAL_TABLET | ORAL | 0 refills | Status: AC

## 2019-04-11 MED ORDER — BUPROPION HCL ER (SR) 150 MG PO TB12: 150.0000 mg | ORAL_TABLET | Freq: Every day | ORAL | 0 refills | Status: DC

## 2019-04-11 NOTE — Progress Notes (Signed)
Chief Complaint:   OBESITY [name] is here to discuss her progress with her obesity treatment plan along with follow-up of her obesity related diagnoses. [name] is on the Category 4 Plan and states she is following her eating plan approximately 30% of the time. [name] states she is exercising 0 minutes 0 times per week.  Today's visit was #: 2 Starting weight: 368 lbs Starting date: 03/28/2019 Today's weight: 369 lbs Today's date: 04/11/2019 Total lbs lost to date: 0 Total lbs lost since last in-office visit: 0  Interim History: [name] is up 1 lb. She has not followed the diet very closely. She states that she hurt her back.  Subjective:   Vitamin D deficiency. [name] is taking high dose Vitamin D. Last Vitamin D 25.2 on 03/28/2019.  Hypokalemia (mild). [name] takes HCTZ.  Essential hypertension. [name] is taking HCTZ. Blood pressure is under good control.  BP Readings from Last 3 Encounters:  04/11/19 137/80  03/28/19 124/84  01/20/19 (!) 165/106   Lab Results  Component Value Date   CREATININE 0.72 03/28/2019   CREATININE 0.79 01/20/2019   CREATININE 0.78 05/09/2016   Insulin resistance. [name] has a diagnosis of insulin resistance based on her elevated fasting insulin level >5. She continues to work on diet and exercise to decrease her risk of diabetes. She reports positive appetite.  Lab Results  Component Value Date   INSULIN 54.8 (H) 03/28/2019   Lab Results  Component Value Date   HGBA1C 5.4 03/28/2019   Other depression, with emotional eating. [name] is struggling with emotional eating and using food for comfort to the extent that it is negatively impacting her health. She has been working on behavior modification techniques to help reduce her emotional eating and has been somewhat successful. She shows no sign of suicidal or homicidal ideations. [name] reports stress/emotional eating.  At risk for hypoglycemia. [name] is at  increased risk for hypoglycemia secondary to dietary changes and insulin resistance.  Assessment/Plan:   Vitamin D deficiency. Low Vitamin D level contributes to fatigue and are associated with obesity, breast, and colon cancer. She was given a prescription for Vitamin D3 50,000 IU once weekly #4 with 0 refills and will stop Vitamin D2. She will follow-up for routine testing of Vitamin D, at least 2-3 times per year to avoid over-replacement.  Hypokalemia (mild).  [name] was given a prescription for potassium chloride (KLOR-CON) 10 MEQ tablet 1 daily #30 with 0 refills.  Essential hypertension. [name] is working on healthy weight loss and exercise to improve blood pressure control. We will watch for signs of hypotension as she continues her lifestyle modifications. She will continue her medication as directed.  Insulin resistance. [name] will continue to work on weight loss, exercise, increasing healthy fats and protein, and decreasing simple carbohydrates to help decrease the risk of diabetes. [name] agreed to follow-up with [place] as directed to closely monitor her progress.  Other depression, with emotional eating. Behavior modification techniques were discussed today to help [name] deal with her emotional/non-hunger eating behaviors.  Orders and follow up as documented in patient record. [name] was given a precription for buPROPion (WELLBUTRIN SR) 150 MG 12 hr tablet 1 daily #30 with 0 refills.  At risk for hypoglycemia. [name] was given approximately 15 minutes of counseling today regarding prevention of hypoglycemia. She was advised of symptoms of hypoglycemia. Sitlaly was instructed to avoid skipping meals, eat regular protein rich meals and schedule low calorie snacks as needed.   Repetitive  spaced learning was employed today to elicit superior memory formation and behavioral change.  Class 3 severe obesity with serious comorbidity and body mass index (BMI) of 50.0 to 59.9 in  adult, unspecified obesity type ([facility]).  [name] is currently in the action stage of change. As such, her goal is to continue with weight loss efforts. She has agreed to the Category 4 Plan.   She will work on meal planning.  We independently reviewed with the patient labs from 03/28/2019 including CMP, Vitamin D, CBC, A1c, insulin, and thyroid panel.  Exercise goals: All adults should avoid inactivity. Some physical activity is better than none, and adults who participate in any amount of physical activity gain some health benefits. She is having back pain and is not active.  Behavioral modification strategies: increasing lean protein intake, decreasing simple carbohydrates, increasing vegetables, increasing water intake, decreasing eating out, no skipping meals, meal planning and cooking strategies, keeping healthy foods in the home and planning for success.  [name] has agreed to follow-up with our clinic in 2 weeks. She was informed of the importance of frequent follow-up visits to maximize her success with intensive lifestyle modifications for her multiple health conditions.   Objective:   Blood pressure 137/80, pulse 85, temperature 98 F (36.7 C), height (P) 5\' 7"  (1.702 m), weight (!) (P) 369 lb (167.4 kg), last menstrual period 03/19/2019, SpO2 98 %. Body mass index is 57.79 kg/m (pended).  General: Cooperative, alert, well developed, in no acute distress. HEENT: Conjunctivae and lids unremarkable. Cardiovascular: Regular rhythm.  Lungs: Normal work of breathing. Neurologic: No focal deficits.   Lab Results  Component Value Date   CREATININE 0.72 03/28/2019   BUN 10 03/28/2019   NA 140 03/28/2019   K 3.4 (L) 03/28/2019   CL 98 03/28/2019   CO2 28 03/28/2019   Lab Results  Component Value Date   ALT 12 03/28/2019   AST 14 03/28/2019   ALKPHOS 101 03/28/2019   BILITOT 0.7 03/28/2019   Lab Results  Component Value Date   HGBA1C 5.4 03/28/2019   Lab Results    Component Value Date   INSULIN 54.8 (H) 03/28/2019   Lab Results  Component Value Date   TSH 1.430 03/28/2019   Lab Results  Component Value Date   CHOL 133 10/23/2008   HDL 49 10/23/2008   LDLCALC 71 10/23/2008   TRIG 65 10/23/2008   CHOLHDL 2.7 Ratio 10/23/2008   Lab Results  Component Value Date   WBC 9.2 03/28/2019   HGB 13.2 03/28/2019   HCT 39.5 03/28/2019   MCV 82 03/28/2019   PLT 216 03/28/2019   Lab Results  Component Value Date   IRON 32 (L) 06/09/2010   TIBC 279 06/09/2010   FERRITIN 23 06/09/2010   Attestation Statements:   Reviewed by clinician on day of visit: allergies, medications, problem list, medical history, surgical history, family history, social history, and previous encounter notes.  06/11/2010, am acting as Fernanda Drum for Energy manager, DO   I have reviewed the above documentation for accuracy and completeness, and I agree with the above. [employer], DO

## 2019-04-12 ENCOUNTER — Encounter (INDEPENDENT_AMBULATORY_CARE_PROVIDER_SITE_OTHER): Payer: Self-pay | Admitting: Bariatrics

## 2019-04-12 ENCOUNTER — Telehealth: Payer: Self-pay | Admitting: Family Medicine

## 2019-04-12 NOTE — Telephone Encounter (Signed)
error 

## 2019-04-26 ENCOUNTER — Ambulatory Visit (INDEPENDENT_AMBULATORY_CARE_PROVIDER_SITE_OTHER): Payer: BC Managed Care – PPO | Admitting: Bariatrics

## 2019-05-08 ENCOUNTER — Ambulatory Visit (INDEPENDENT_AMBULATORY_CARE_PROVIDER_SITE_OTHER): Payer: BC Managed Care – PPO | Admitting: Bariatrics

## 2019-05-08 ENCOUNTER — Other Ambulatory Visit: Payer: Self-pay

## 2019-05-16 ENCOUNTER — Ambulatory Visit (INDEPENDENT_AMBULATORY_CARE_PROVIDER_SITE_OTHER): Payer: BC Managed Care – PPO | Admitting: Family Medicine

## 2019-05-17 ENCOUNTER — Other Ambulatory Visit: Payer: Self-pay | Admitting: Unknown Physician Specialty

## 2019-05-17 ENCOUNTER — Telehealth: Payer: Self-pay | Admitting: Unknown Physician Specialty

## 2019-05-17 DIAGNOSIS — U071 COVID-19: Secondary | ICD-10-CM

## 2019-05-17 MED ORDER — SODIUM CHLORIDE 0.9 % IV SOLN
Freq: Once | INTRAVENOUS | Status: DC
  Filled 2019-05-17: qty 20

## 2019-05-17 NOTE — Telephone Encounter (Signed)
  I connected by phone with [name] on 05/17/2019 at 4:11 PM to discuss the potential use of an new treatment for mild to moderate COVID-19 viral infection in non-hospitalized patients.  This patient is a 49 y.o. female that meets the FDA criteria for Emergency Use Authorization of bamlanivimab/etesevimab or casirivimab/imdevimab.  Has a (+) direct SARS-CoV-2 viral test result  Has mild or moderate COVID-19   Is ? 48 years of age and weighs ? 40 kg  Is NOT hospitalized due to COVID-19  Is NOT requiring oxygen therapy or requiring an increase in baseline oxygen flow rate due to COVID-19  Is within 10 days of symptom onset  Has at least one of the high risk factor(s) for progression to severe COVID-19 and/or hospitalization as defined in EUA.  Specific high risk criteria : BMI >/= 35   I have spoken and communicated the following to the patient or parent/caregiver:  1. FDA has authorized the emergency use of bamlanivimab/etesevimab and casirivimab\imdevimab for the treatment of mild to moderate COVID-19 in adults and pediatric patients with positive results of direct SARS-CoV-2 viral testing who are 44 years of age and older weighing at least 40 kg, and who are at high risk for progressing to severe COVID-19 and/or hospitalization.  2. The significant known and potential risks and benefits of bamlanivimab/etesevimab and casirivimab\imdevimab, and the extent to which such potential risks and benefits are unknown.  3. Information on available alternative treatments and the risks and benefits of those alternatives, including clinical trials.  4. Patients treated with bamlanivimab/etesevimab and casirivimab\imdevimab should continue to self-isolate and use infection control measures (e.g., wear mask, isolate, social distance, avoid sharing personal items, clean and disinfect "high touch" surfaces, and frequent handwashing) according to CDC guidelines.   5. The patient or  parent/caregiver has the option to accept or refuse bamlanivimab/etesevimab or casirivimab\imdevimab .  After reviewing this information with the patient, The patient agreed to proceed with receiving the bamlanimivab infusion and will be provided a copy of the Fact sheet prior to receiving the infusion.Gabriel Cirri 05/17/2019 4:11 PM   Sx onset 4/24

## 2019-05-18 ENCOUNTER — Ambulatory Visit (HOSPITAL_COMMUNITY)
Admission: RE | Admit: 2019-05-18 | Discharge: 2019-05-18 | Disposition: A | Discharge status: Home / Self Care | Payer: BC Managed Care – PPO | Source: Ambulatory Visit | Attending: Pulmonary Disease | Admitting: Pulmonary Disease

## 2019-05-18 DIAGNOSIS — U071 COVID-19: Secondary | ICD-10-CM

## 2019-07-13 ENCOUNTER — Ambulatory Visit
Admission: EM | Admit: 2019-07-13 | Discharge: 2019-07-13 | Disposition: A | Discharge status: Home / Self Care | Payer: BC Managed Care – PPO | Attending: Physician Assistant | Admitting: Physician Assistant

## 2019-07-13 DIAGNOSIS — M546 Pain in thoracic spine: Secondary | ICD-10-CM | POA: Insufficient documentation

## 2019-07-13 DIAGNOSIS — N3090 Cystitis, unspecified without hematuria: Secondary | ICD-10-CM | POA: Diagnosis present

## 2019-07-13 LAB — POCT URINE PREGNANCY: Preg Test, Ur: NEGATIVE

## 2019-07-13 LAB — POCT URINALYSIS DIP (MANUAL ENTRY)
Bilirubin, UA: NEGATIVE
Glucose, UA: NEGATIVE mg/dL
Ketones, POC UA: NEGATIVE mg/dL
Nitrite, UA: NEGATIVE
Protein Ur, POC: NEGATIVE mg/dL
Spec Grav, UA: 1.025 (ref 1.010–1.025)
Urobilinogen, UA: 0.2 E.U./dL
pH, UA: 5 (ref 5.0–8.0)

## 2019-07-13 MED ORDER — FLUCONAZOLE 150 MG PO TABS: 150.0000 mg | ORAL_TABLET | Freq: Every day | ORAL | 0 refills | Status: DC

## 2019-07-13 MED ORDER — TIZANIDINE HCL 2 MG PO TABS: 2.0000 mg | ORAL_TABLET | Freq: Three times a day (TID) | ORAL | 0 refills | Status: DC | PRN

## 2019-07-13 MED ORDER — CEPHALEXIN 500 MG PO CAPS: 500.0000 mg | ORAL_CAPSULE | Freq: Two times a day (BID) | ORAL | 0 refills | Status: DC

## 2019-07-13 MED ORDER — KETOROLAC TROMETHAMINE 30 MG/ML IJ SOLN
30.0000 mg | Freq: Once | INTRAMUSCULAR | Status: AC
  Administered 2019-07-13: 30 mg via INTRAMUSCULAR

## 2019-07-13 NOTE — ED Provider Notes (Signed)
[facility]    CSN: [id number] Arrival date & time: 07/13/19  1516      History   Chief Complaint Chief Complaint  Patient presents with   Back Pain   Dysuria   Polyuria    HPI MIAH BOYE is a 49 y.o. female.   49 year old female comes in for 1 day history of urinary symptoms. Urinary frequency, dysuria. Had one episode of hematuria that has since resolved. Right flank pain that is associated with movement. Denies radiation of pain.  Denies abdominal pain, nausea, vomiting. Denies fever, chills, flank/back pain. Denies vaginal discharge, itching, spotting. LMP 06/19/2019.      Past Medical History:  Diagnosis Date   Allergic rhinitis    Anxiety    B12 deficiency    DDD (degenerative disc disease), lumbar    Depression    Hypertension    Joint pain    Low back pain    Migraines    Morbid obesity (HCC)    Vitamin D deficiency     Patient Active Problem List   Diagnosis Date Noted   Vitamin D insufficiency 03/29/2019   Low serum potassium level 03/29/2019   DJD (degenerative joint disease), lumbar 10/18/2011   Migraines 09/13/2011   HTN (hypertension) 03/31/2011   ANEMIA NEC 07/06/2006   HYPERTENSION, BENIGN ESSENTIAL 07/06/2006   DEGENERATIVE JOINT DISEASE 07/06/2006   OSTEOARTHROSIS NOS, LOWER LEG 07/06/2006    Past Surgical History:  Procedure Laterality Date   ENDOMETRIAL ABLATION     FOOT FRACTURE SURGERY Right 2012   KNEE SURGERY  1998   TUBAL LIGATION  1999    OB History    Gravida  4   Para  4   Term      Preterm      AB      Living        SAB      TAB      Ectopic      Multiple      Live Births               Home Medications    Prior to Admission medications   Medication Sig Start Date End Date Taking? Authorizing Provider  cephALEXin (KEFLEX) 500 MG capsule Take 1 capsule (500 mg total) by mouth 2 (two) times daily. 07/13/19   Belinda Fisher, PA-C  Cholecalciferol 1.25 MG  (50000 UT) TABS Take 1 weekly 04/11/19   Corinna Capra A, DO  fluconazole (DIFLUCAN) 150 MG tablet Take 1 tablet (150 mg total) by mouth daily. Take second dose 72 hours later if symptoms still persists. 07/13/19   Cathie Hoops, Ethyle Tiedt V, PA-C  hydrochlorothiazide (HYDRODIURIL) 25 MG tablet Take 25 tablets by mouth daily. 03/25/16   [provider]  meloxicam (MOBIC) 15 MG tablet Take 25 mg by mouth in the morning, at noon, and at bedtime.    [provider]  potassium chloride (KLOR-CON) 10 MEQ tablet Take 1 tablet (10 mEq total) by mouth daily. 04/11/19   Corinna Capra A, DO  tiZANidine (ZANAFLEX) 2 MG tablet Take 1 tablet (2 mg total) by mouth every 8 (eight) hours as needed for muscle spasms. 07/13/19   Cathie Hoops, Kenyonna Micek V, PA-C  triamcinolone cream (KENALOG) 0.1 % Apply 1 application topically daily. 05/06/16   [provider]  buPROPion (WELLBUTRIN SR) 150 MG 12 hr tablet Take 1 tablet (150 mg total) by mouth daily. 04/11/19 07/13/19  Roswell Nickel, DO    Family  History Family History  Problem Relation Age of Onset   Kidney failure Mother    Hypertension Mother    Diabetes Mother    Hypertension Sister    Diabetes Sister    Cancer Sister    Cancer Cousin     Social History Social History   Tobacco Use   Smoking status: Never Smoker   Smokeless tobacco: Never Used  Substance Use Topics   Alcohol use: No   Drug use: No     Allergies   Topamax [topiramate] and Escitalopram oxalate   Review of Systems Review of Systems  Reason unable to perform ROS: See HPI as above.     Physical Exam Triage Vital Signs ED Triage Vitals  Enc Vitals Group     BP 07/13/19 1549 126/82     Pulse Rate 07/13/19 1549 83     Resp 07/13/19 1549 18     Temp 07/13/19 1549 99 F (37.2 C)     Temp Source 07/13/19 1549 Oral     SpO2 07/13/19 1549 99 %     Weight --      Height --      Head Circumference --      Peak Flow --      Pain Score 07/13/19 1552 9     Pain Loc --       Pain Edu? --      Excl. in [place]? --    No data found.  Updated Vital Signs BP 126/82 (BP Location: Right Arm)    Pulse 83    Temp 99 F (37.2 C) (Oral)    Resp 18    LMP 06/19/2019 (Exact Date)    SpO2 99%   Physical Exam Constitutional:      General: She is not in acute distress.    Appearance: She is well-developed. She is not ill-appearing, toxic-appearing or diaphoretic.  HENT:     Head: Normocephalic and atraumatic.  Eyes:     Conjunctiva/sclera: Conjunctivae normal.     Pupils: Pupils are equal, round, and reactive to light.  Cardiovascular:     Rate and Rhythm: Normal rate and regular rhythm.  Pulmonary:     Effort: Pulmonary effort is normal. No respiratory distress.     Comments: LCTAB Abdominal:     General: Bowel sounds are normal.     Palpations: Abdomen is soft.     Tenderness: There is no abdominal tenderness. There is no right CVA tenderness, left CVA tenderness, guarding or rebound.  Musculoskeletal:     Cervical back: Normal range of motion and neck supple.     Comments: No tenderness to palpation of the spinous processes. Tenderness to palpation of right lower thoracic back. Full ROM of the back  Skin:    General: Skin is warm and dry.  Neurological:     Mental Status: She is alert and oriented to person, place, and time.  Psychiatric:        Behavior: Behavior normal.        Judgment: Judgment normal.      UC Treatments / Results  Labs (all labs ordered are listed, but only abnormal results are displayed) Labs Reviewed  POCT URINALYSIS DIP (MANUAL ENTRY) - Abnormal; Notable for the following components:      Result Value   Blood, UA trace-intact (*)    Leukocytes, UA Trace (*)    All other components within normal limits  URINE CULTURE  POCT URINE PREGNANCY    EKG  Radiology No results found.  Procedures Procedures (including critical care time)  Medications Ordered in UC Medications  ketorolac (TORADOL) 30 MG/ML injection 30 mg (has  no administration in time range)    Initial Impression / Assessment and Plan / UC Course  I have reviewed the triage vital signs and the nursing notes.  Pertinent labs & imaging results that were available during my care of the patient were reviewed by me and considered in my medical decision making (see chart for details).     Urine with trace blood, trace leukocytes. Will cover for cystitis with keflex. Back pain reproducible by palpation, negative CVA tenderness, lower suspicion for pyelonephritis/urethral stone. toradol injection for back pain. Tizanidine as needed. Push fluids. Return precautions given.   Final Clinical Impressions(s) / UC Diagnoses   Final diagnoses:  Cystitis  Acute right-sided thoracic back pain    ED Prescriptions    Medication Sig Dispense Auth. Provider   cephALEXin (KEFLEX) 500 MG capsule Take 1 capsule (500 mg total) by mouth 2 (two) times daily. 10 capsule Minerva Bluett V, PA-C   fluconazole (DIFLUCAN) 150 MG tablet Take 1 tablet (150 mg total) by mouth daily. Take second dose 72 hours later if symptoms still persists. 2 tablet Kristy Catoe V, PA-C   tiZANidine (ZANAFLEX) 2 MG tablet Take 1 tablet (2 mg total) by mouth every 8 (eight) hours as needed for muscle spasms. 15 tablet Belinda Fisher, PA-C     PDMP not reviewed this encounter.   Belinda Fisher, PA-C 07/13/19 1620

## 2019-07-13 NOTE — ED Triage Notes (Signed)
Patient presents today with complaints of right sided lower  Back pain, dysuria and polyuria that began last night. Patient states she does have a history of UTIs.  Patient denies: fever, hematuria

## 2019-07-13 NOTE — Discharge Instructions (Addendum)
Your urine was positive for an urinary tract infection. Start keflex as directed. Toradol injection of back pain. Tizanidine as needed. Keep hydrated, urine should be clear to pale yellow in color. Monitor for any worsening of symptoms, fever, abdominal pain, nausea/vomiting, fo to the ED for further evaluation.

## 2019-07-14 ENCOUNTER — Other Ambulatory Visit: Payer: Self-pay

## 2019-07-14 ENCOUNTER — Inpatient Hospital Stay (HOSPITAL_COMMUNITY)
Admission: AD | Admit: 2019-07-14 | Discharge: 2019-07-14 | Disposition: A | Discharge status: Home / Self Care | Payer: BC Managed Care – PPO | Attending: Obstetrics and Gynecology | Admitting: Obstetrics and Gynecology

## 2019-07-14 DIAGNOSIS — R8271 Bacteriuria: Secondary | ICD-10-CM

## 2019-07-14 DIAGNOSIS — N390 Urinary tract infection, site not specified: Secondary | ICD-10-CM | POA: Diagnosis present

## 2019-07-14 DIAGNOSIS — M549 Dorsalgia, unspecified: Secondary | ICD-10-CM

## 2019-07-14 DIAGNOSIS — R109 Unspecified abdominal pain: Secondary | ICD-10-CM

## 2019-07-14 LAB — POCT PREGNANCY, URINE: Preg Test, Ur: NEGATIVE

## 2019-07-14 NOTE — MAU Note (Signed)
Was seen at urgent care for UTI yesterday.  Patient states she is feeling severe stabbing right lower back pain that radiates to her abdomen.  Felt a pop when she got up at 0400 and states since then the pain has been excruciating.  Also reports dysuria, frequency and hesitation.  Says they didn't have methods of imaging at urgent care yesterday so she wants to be evaluated further.  Patient states she was evaluated last year for cysts but didn't follow up d/t COVID.  LMP 06/19/2019 but they are irregular.  States she does not think she could be pregnant.

## 2019-07-14 NOTE — MAU Provider Note (Signed)
First Provider Initiated Contact with Patient 07/14/19 [phone]      S Ms. [name] is a 49 y.o. G35P4 non-pregnant female who presents to MAU today with complaint of back pain and worsening UTI. Patient states she was diagnosed with a UTI yesterday and give antibiotics.  She reports having taken two doses and one flexeril with no improvement of symptoms.   O BP 116/69    Pulse 91    Temp 98.4 F (36.9 C)    Resp 19    Wt (!) 168.4 kg    LMP 06/19/2019 (Exact Date)    BMI (P) 58.16 kg/m  Physical Exam Vitals reviewed.  Constitutional:      General: She is in acute distress (Mild Distress).     Appearance: Normal appearance. She is obese.  HENT:     Head: Normocephalic and atraumatic.  Eyes:     Conjunctiva/sclera: Conjunctivae normal.  Cardiovascular:     Rate and Rhythm: Regular rhythm.  Pulmonary:     Effort: Pulmonary effort is normal.  Musculoskeletal:        General: Normal range of motion.  Skin:    General: Skin is warm and dry.  Neurological:     Mental Status: She is alert and oriented to person, place, and time.  Psychiatric:        Mood and Affect: Mood normal.        Behavior: Behavior normal.        Thought Content: Thought content normal.     A Non pregnant female Medical screening exam complete  Back Pain Known UTI    P Discharge from MAU in stable condition. Patient given the option of transfer to [facility] for further evaluation or seek care in outpatient facility of choice. Patient states she will go to [facility]. Warning signs for worsening condition that would warrant emergency follow-up discussed. Patient may return to MAU as needed for pregnancy related complaints  Gerrit Heck, [place] 07/14/2019 6:59 AM

## 2019-07-15 ENCOUNTER — Encounter (HOSPITAL_COMMUNITY): Payer: Self-pay

## 2019-07-15 ENCOUNTER — Ambulatory Visit (HOSPITAL_COMMUNITY)
Admission: EM | Admit: 2019-07-15 | Discharge: 2019-07-15 | Disposition: A | Discharge status: Home / Self Care | Payer: BC Managed Care – PPO | Attending: Urgent Care | Admitting: Urgent Care

## 2019-07-15 DIAGNOSIS — M5136 Other intervertebral disc degeneration, lumbar region: Secondary | ICD-10-CM

## 2019-07-15 DIAGNOSIS — M545 Low back pain, unspecified: Secondary | ICD-10-CM

## 2019-07-15 DIAGNOSIS — R109 Unspecified abdominal pain: Secondary | ICD-10-CM

## 2019-07-15 LAB — POCT URINALYSIS DIP (DEVICE)
Bilirubin Urine: NEGATIVE
Glucose, UA: NEGATIVE mg/dL
Hgb urine dipstick: NEGATIVE
Ketones, ur: NEGATIVE mg/dL
Leukocytes,Ua: NEGATIVE
Nitrite: NEGATIVE
Protein, ur: NEGATIVE mg/dL
Specific Gravity, Urine: 1.025 (ref 1.005–1.030)
Urobilinogen, UA: 0.2 mg/dL (ref 0.0–1.0)
pH: 6 (ref 5.0–8.0)

## 2019-07-15 LAB — URINE CULTURE: Culture: <10000 — AB

## 2019-07-15 MED ORDER — KETOROLAC TROMETHAMINE 60 MG/2ML IM SOLN
60.0000 mg | Freq: Once | INTRAMUSCULAR | Status: AC
  Administered 2019-07-15: 60 mg via INTRAMUSCULAR

## 2019-07-15 MED ORDER — CELECOXIB 200 MG PO CAPS
200.0000 mg | ORAL_CAPSULE | Freq: Two times a day (BID) | ORAL | 0 refills | Status: DC
Start: 2019-07-15 — End: 2021-11-01

## 2019-07-15 MED ORDER — KETOROLAC TROMETHAMINE 60 MG/2ML IM SOLN
INTRAMUSCULAR | Status: AC
  Filled 2019-07-15: qty 2

## 2019-07-15 NOTE — ED Provider Notes (Signed)
[facility]   MRN: [id number] DOB: [birth year]  Subjective:   [name] is a 49 y.o. female presenting for ongoing back pain, dysuria.  Patient has been seen twice in the past 2 days.  Initially seen at an urgent care, prescribed Keflex for suspected UTI.  Was also prescribed fluconazole and tizanidine.  She subsequently went to the maternal admission unit.  Has had 2 negative urine pregnancy test in the past 2 days.  Urine culture was equivocal showing less than 10,000 colonies/mL, insignificant growth.  Has a history of uterine fibroids seen on CT abdomen pelvis from 03/29/2018.  Has a history of tubal ligation.  MRI of the lumbar spine from 11/22/2013 showed multilevel facet arthropathy between L3-S1, bulging disks over the same areas.  However, patient has significant concern that her right-sided back pain and flank pain is not related to her back at all.  No current facility-administered medications for this encounter.  Current Outpatient Medications:  .  cephALEXin (KEFLEX) 500 MG capsule, Take 1 capsule (500 mg total) by mouth 2 (two) times daily., Disp: 10 capsule, Rfl: 0 .  Cholecalciferol 1.25 MG (50000 UT) TABS, Take 1 weekly, Disp: 4 tablet, Rfl: 0 .  hydrochlorothiazide (HYDRODIURIL) 25 MG tablet, Take 25 tablets by mouth daily., Disp: , Rfl:  .  potassium chloride (KLOR-CON) 10 MEQ tablet, Take 1 tablet (10 mEq total) by mouth daily., Disp: 30 tablet, Rfl: 0 .  tiZANidine (ZANAFLEX) 2 MG tablet, Take 1 tablet (2 mg total) by mouth every 8 (eight) hours as needed for muscle spasms., Disp: 15 tablet, Rfl: 0 .  fluconazole (DIFLUCAN) 150 MG tablet, Take 1 tablet (150 mg total) by mouth daily. Take second dose 72 hours later if symptoms still persists., Disp: 2 tablet, Rfl: 0 .  triamcinolone cream (KENALOG) 0.1 %, Apply 1 application topically daily., Disp: , Rfl: 0   Allergies  Allergen Reactions  . Topamax [Topiramate] Itching and Nausea And Vomiting  . Escitalopram  Oxalate Hives and Itching    Pt states itchiness and irritation when taking this medication    Past Medical History:  Diagnosis Date  . Allergic rhinitis   . Anxiety   . B12 deficiency   . DDD (degenerative disc disease), lumbar   . Depression   . Hypertension   . Joint pain   . Low back pain   . Migraines   . Morbid obesity (HCC)   . Vitamin D deficiency      Past Surgical History:  Procedure Laterality Date  . ENDOMETRIAL ABLATION    . FOOT FRACTURE SURGERY Right 2012  . KNEE SURGERY  1998  . TUBAL LIGATION  1999    Family History  Problem Relation Age of Onset  . Kidney failure Mother   . Hypertension Mother   . Diabetes Mother   . Hypertension Sister   . Diabetes Sister   . Cancer Sister   . Cancer Cousin     Social History   Tobacco Use  . Smoking status: Never Smoker  . Smokeless tobacco: Never Used  Substance Use Topics  . Alcohol use: No  . Drug use: No    ROS   Objective:   Vitals: BP (!) 152/91 (BP Location: Right Arm)   Pulse 86   Temp 98.3 F (36.8 C) (Oral)   Resp 16   LMP 06/19/2019 (Exact Date)   SpO2 99%   Physical Exam Constitutional:      General: She is not in acute  distress.    Appearance: Normal appearance. She is well-developed. She is obese. She is not ill-appearing, toxic-appearing or diaphoretic.  HENT:     Head: Normocephalic and atraumatic.     Nose: Nose normal.     Mouth/Throat:     Mouth: Mucous membranes are moist.     Pharynx: Oropharynx is clear.  Eyes:     General: No scleral icterus.    Extraocular Movements: Extraocular movements intact.     Pupils: Pupils are equal, round, and reactive to light.  Cardiovascular:     Rate and Rhythm: Normal rate.  Pulmonary:     Effort: Pulmonary effort is normal.  Abdominal:     Tenderness: There is no right CVA tenderness or left CVA tenderness.  Musculoskeletal:     Lumbar back: Tenderness (Over area outlined) present. No swelling, edema, deformity, signs of  trauma, lacerations, spasms or bony tenderness. Normal range of motion. Negative right straight leg raise test and negative left straight leg raise test. No scoliosis.       Back:  Skin:    General: Skin is warm and dry.     Findings: No rash.  Neurological:     General: No focal deficit present.     Mental Status: She is alert and oriented to person, place, and time.     Motor: No weakness.     Coordination: Coordination normal.     Gait: Gait normal.     Deep Tendon Reflexes: Reflexes normal.  Psychiatric:        Mood and Affect: Mood normal.        Behavior: Behavior normal.        Thought Content: Thought content normal.        Judgment: Judgment normal.     Results for orders placed or performed during the hospital encounter of 07/15/19 (from the past 24 hour(s))  POCT urinalysis dip (device)     Status: None   Collection Time: 07/15/19 10:36 AM  Result Value Ref Range   Glucose, UA NEGATIVE NEGATIVE mg/dL   Bilirubin Urine NEGATIVE NEGATIVE   Ketones, ur NEGATIVE NEGATIVE mg/dL   Specific Gravity, Urine 1.025 1.005 - 1.030   Hgb urine dipstick NEGATIVE NEGATIVE   pH 6.0 5.0 - 8.0   Protein, ur NEGATIVE NEGATIVE mg/dL   Urobilinogen, UA 0.2 0.0 - 1.0 mg/dL   Nitrite NEGATIVE NEGATIVE   Leukocytes,Ua NEGATIVE NEGATIVE    Assessment and Plan :   PDMP not reviewed this encounter.  1. Acute right-sided low back pain without sciatica   2. Degenerative disc disease, lumbar   3. Right flank pain     Patient has had a history of back problems and used to see a back specialist, underwent many modes of treatment but has not followed up in a while.  Discussed this with patient as I suspect that her symptoms are related to musculoskeletal source.  Informed her that if she wanted to make sure it was not an intra-abdominal process, she would have to go to the emergency room for CT scan.  Patient is not interested in this.  Offered IM Toradol, Celebrex for her symptoms.  She  states she prefers to follow-up with her PCP as soon as possible. Counseled patient on potential for adverse effects with medications prescribed/recommended today, ER and return-to-clinic precautions discussed, patient verbalized understanding.    Jaynee Eagles, PA-C 07/15/19 1149

## 2019-07-15 NOTE — ED Triage Notes (Signed)
Pt presents today for right sided back pain. Pt seen at [facility] on Friday dx with UTI, given antibiotics and muscle relaxer. Pt states pain has not improved. Pt states pain is 9/10. Pt has full range of motion, ambulated unassisted into treatment space unassisted. Pt endorses taking AZO with out relief. Pt endorses painful urination. Pt denies numbness or tingling in lower extremities.

## 2020-03-31 ENCOUNTER — Other Ambulatory Visit: Payer: Self-pay

## 2020-03-31 ENCOUNTER — Ambulatory Visit: Payer: Self-pay

## 2020-03-31 ENCOUNTER — Other Ambulatory Visit: Payer: Self-pay | Admitting: Occupational Medicine

## 2020-03-31 DIAGNOSIS — M545 Low back pain, unspecified: Secondary | ICD-10-CM

## 2020-04-04 ENCOUNTER — Other Ambulatory Visit: Payer: Self-pay | Admitting: Obstetrics and Gynecology

## 2020-04-04 DIAGNOSIS — N924 Excessive bleeding in the premenopausal period: Secondary | ICD-10-CM

## 2020-04-21 ENCOUNTER — Other Ambulatory Visit: Payer: Self-pay

## 2020-05-12 ENCOUNTER — Other Ambulatory Visit: Payer: Self-pay | Admitting: Orthopedic Surgery

## 2020-05-12 DIAGNOSIS — M5416 Radiculopathy, lumbar region: Secondary | ICD-10-CM

## 2020-05-13 ENCOUNTER — Ambulatory Visit
Admission: RE | Admit: 2020-05-13 | Discharge: 2020-05-13 | Disposition: A | Discharge status: Home / Self Care | Payer: Self-pay | Source: Ambulatory Visit | Attending: Obstetrics and Gynecology | Admitting: Obstetrics and Gynecology

## 2020-05-13 DIAGNOSIS — N924 Excessive bleeding in the premenopausal period: Secondary | ICD-10-CM

## 2020-07-23 ENCOUNTER — Emergency Department (HOSPITAL_BASED_OUTPATIENT_CLINIC_OR_DEPARTMENT_OTHER): Payer: BC Managed Care – PPO | Admitting: Radiology

## 2020-07-23 ENCOUNTER — Other Ambulatory Visit: Payer: Self-pay

## 2020-07-23 ENCOUNTER — Encounter (HOSPITAL_BASED_OUTPATIENT_CLINIC_OR_DEPARTMENT_OTHER): Payer: Self-pay

## 2020-07-23 ENCOUNTER — Emergency Department (HOSPITAL_BASED_OUTPATIENT_CLINIC_OR_DEPARTMENT_OTHER)
Admission: EM | Admit: 2020-07-23 | Discharge: 2020-07-24 | Disposition: A | Discharge status: Home / Self Care | Payer: BC Managed Care – PPO | Attending: Emergency Medicine | Admitting: Emergency Medicine

## 2020-07-23 DIAGNOSIS — Z20822 Contact with and (suspected) exposure to covid-19: Secondary | ICD-10-CM | POA: Diagnosis not present

## 2020-07-23 DIAGNOSIS — Z79899 Other long term (current) drug therapy: Secondary | ICD-10-CM | POA: Insufficient documentation

## 2020-07-23 DIAGNOSIS — I10 Essential (primary) hypertension: Secondary | ICD-10-CM | POA: Diagnosis not present

## 2020-07-23 DIAGNOSIS — R059 Cough, unspecified: Secondary | ICD-10-CM | POA: Diagnosis present

## 2020-07-23 DIAGNOSIS — J40 Bronchitis, not specified as acute or chronic: Secondary | ICD-10-CM | POA: Insufficient documentation

## 2020-07-23 NOTE — ED Triage Notes (Signed)
Pt reports coughing and chest congestion beginning yesterday after mowing. Pt endorses chest tightness and a squeezing sensation that started today while at rest.

## 2020-07-24 LAB — SARS CORONAVIRUS 2 (TAT 6-24 HRS): SARS Coronavirus 2: NEGATIVE

## 2020-07-24 MED ORDER — ALBUTEROL SULFATE HFA 108 (90 BASE) MCG/ACT IN AERS
2.0000 | INHALATION_SPRAY | RESPIRATORY_TRACT | Status: DC | PRN
  Administered 2020-07-24: 2 via RESPIRATORY_TRACT
  Filled 2020-07-24: qty 6.7

## 2020-07-24 MED ORDER — AEROCHAMBER PLUS W/MASK MISC
1.0000 | Freq: Once | Status: AC
  Administered 2020-07-24: 1
  Filled 2020-07-24: qty 1

## 2020-07-24 MED ORDER — PREDNISONE 20 MG PO TABS
40.0000 mg | ORAL_TABLET | Freq: Every day | ORAL | 0 refills | Status: DC
Start: 2020-07-24 — End: 2021-05-07

## 2020-07-24 NOTE — ED Provider Notes (Signed)
[facility] EMERGENCY DEPT Provider Note   CSN: [id number] Arrival date & time: 07/23/20  2233     History Chief Complaint  Patient presents with   Cough    [name] is a 50 y.o. female.  Patient presents to the emergency department for evaluation of cough and chest tightness.  Symptoms began after mowing the lawn.  She reports that she is bringing up some lime colored sputum.  No fever, nausea, vomiting, diarrhea.  Patient reports that she has previously had COVID but has only had 1 vaccination shot.      Past Medical History:  Diagnosis Date   Allergic rhinitis    Anxiety    B12 deficiency    DDD (degenerative disc disease), lumbar    Depression    Hypertension    Joint pain    Low back pain    Migraines    Morbid obesity (HCC)    Vitamin D deficiency     Patient Active Problem List   Diagnosis Date Noted   Vitamin D insufficiency 03/29/2019   Low serum potassium level 03/29/2019   DJD (degenerative joint disease), lumbar 10/18/2011   Migraines 09/13/2011   HTN (hypertension) 03/31/2011   ANEMIA NEC 07/06/2006   HYPERTENSION, BENIGN ESSENTIAL 07/06/2006   DEGENERATIVE JOINT DISEASE 07/06/2006   OSTEOARTHROSIS NOS, LOWER LEG 07/06/2006    Past Surgical History:  Procedure Laterality Date   ENDOMETRIAL ABLATION     FOOT FRACTURE SURGERY Right 2012   KNEE SURGERY  1998   TUBAL LIGATION  1999     OB History     Gravida  4   Para  4   Term      Preterm      AB      Living         SAB      IAB      Ectopic      Multiple      Live Births              Family History  Problem Relation Age of Onset   Kidney failure Mother    Hypertension Mother    Diabetes Mother    Hypertension Sister    Diabetes Sister    Cancer Sister    Cancer Cousin     Social History   Tobacco Use   Smoking status: Never   Smokeless tobacco: Never  Substance Use Topics   Alcohol use: No   Drug use: No    Home  Medications Prior to Admission medications   Medication Sig Start Date End Date Taking? Authorizing Provider  predniSONE (DELTASONE) 20 MG tablet Take 2 tablets (40 mg total) by mouth daily with breakfast. 07/24/20  Yes Nyxon Strupp, Canary Brim, MD  celecoxib (CELEBREX) 200 MG capsule Take 1 capsule (200 mg total) by mouth 2 (two) times daily. 07/15/19   Wallis Bamberg, PA-C  cephALEXin (KEFLEX) 500 MG capsule Take 1 capsule (500 mg total) by mouth 2 (two) times daily. 07/13/19   Belinda Fisher, PA-C  Cholecalciferol 1.25 MG (50000 UT) TABS Take 1 weekly 04/11/19   Corinna Capra A, DO  fluconazole (DIFLUCAN) 150 MG tablet Take 1 tablet (150 mg total) by mouth daily. Take second dose 72 hours later if symptoms still persists. 07/13/19   Cathie Hoops, Amy V, PA-C  hydrochlorothiazide (HYDRODIURIL) 25 MG tablet Take 25 tablets by mouth daily. 03/25/16   [provider]  potassium chloride (KLOR-CON) 10 MEQ tablet Take 1 tablet (  10 mEq total) by mouth daily. 04/11/19   Corinna Capra A, DO  tiZANidine (ZANAFLEX) 2 MG tablet Take 1 tablet (2 mg total) by mouth every 8 (eight) hours as needed for muscle spasms. 07/13/19   Cathie Hoops, Amy V, PA-C  triamcinolone cream (KENALOG) 0.1 % Apply 1 application topically daily. 05/06/16   [provider]  buPROPion (WELLBUTRIN SR) 150 MG 12 hr tablet Take 1 tablet (150 mg total) by mouth daily. 04/11/19 07/13/19  Corinna Capra A, DO    Allergies    Escitalopram, Escitalopram oxalate, and Topiramate  Review of Systems   Review of Systems  Constitutional:  Negative for fever.  Respiratory:  Positive for cough and chest tightness.   All other systems reviewed and are negative.  Physical Exam Updated Vital Signs BP 115/73   Pulse 84   Temp 98.5 F (36.9 C) (Oral)   Resp 18   Ht 5\' 9"  (1.753 m)   Wt (!) 142.9 kg   SpO2 96%   BMI 46.52 kg/m   Physical Exam Vitals and nursing note reviewed.  Constitutional:      General: She is not in acute distress.    Appearance: Normal  appearance. She is well-developed.  HENT:     Head: Normocephalic and atraumatic.     Right Ear: Hearing normal.     Left Ear: Hearing normal.     Nose: Nose normal.  Eyes:     Conjunctiva/sclera: Conjunctivae normal.     Pupils: Pupils are equal, round, and reactive to light.  Cardiovascular:     Rate and Rhythm: Regular rhythm.     Heart sounds: S1 normal and S2 normal. No murmur heard.   No friction rub. No gallop.  Pulmonary:     Effort: Pulmonary effort is normal. Prolonged expiration present. No respiratory distress.     Breath sounds: Normal breath sounds. Decreased air movement present.  Chest:     Chest wall: No tenderness.  Abdominal:     General: Bowel sounds are normal.     Palpations: Abdomen is soft.     Tenderness: There is no abdominal tenderness. There is no guarding or rebound. Negative signs include Murphy's sign and McBurney's sign.     Hernia: No hernia is present.  Musculoskeletal:        General: Normal range of motion.     Cervical back: Normal range of motion and neck supple.  Skin:    General: Skin is warm and dry.     Findings: No rash.  Neurological:     Mental Status: She is alert and oriented to person, place, and time.     GCS: GCS eye subscore is 4. GCS verbal subscore is 5. GCS motor subscore is 6.     Cranial Nerves: No cranial nerve deficit.     Sensory: No sensory deficit.     Coordination: Coordination normal.  Psychiatric:        Speech: Speech normal.        Behavior: Behavior normal.        Thought Content: Thought content normal.    ED Results / Procedures / Treatments   Labs (all labs ordered are listed, but only abnormal results are displayed) Labs Reviewed  SARS CORONAVIRUS 2 (TAT 6-24 HRS)    EKG EKG Interpretation  Date/Time:  Wednesday July 23 2020 22:49:11 EDT Ventricular Rate:  84 PR Interval:  160 QRS Duration: 82 QT Interval:  374 QTC Calculation: 441 R Axis:   14  Text Interpretation: Normal sinus rhythm  Nonspecific T wave abnormality Abnormal ECG Confirmed by Gilda Crease [phone]) on 07/24/2020 1:09:20 AM  Radiology DG Chest 2 View  Result Date: 07/23/2020 CLINICAL DATA:  Cough, chest pain, shortness of breath beginning yesterday. EXAM: CHEST - 2 VIEW COMPARISON:  02/26/2014 FINDINGS: The heart size and mediastinal contours are within normal limits. Both lungs are clear. The visualized skeletal structures are unremarkable. IMPRESSION: No active cardiopulmonary disease. Electronically Signed   By: Burman Nieves M.D.   On: 07/23/2020 23:09    Procedures Procedures   Medications Ordered in ED Medications  albuterol (VENTOLIN HFA) 108 (90 Base) MCG/ACT inhaler 2 puff (has no administration in time range)  aerochamber plus with mask device 1 each (has no administration in time range)    ED Course  I have reviewed the triage vital signs and the nursing notes.  Pertinent labs & imaging results that were available during my care of the patient were reviewed by me and considered in my medical decision making (see chart for details).    MDM Rules/Calculators/A&P                          Patient presents to the emergency department for evaluation of cough and chest tightness after mowing the lawn.  Patient with mild bronchospasm on arrival.  She is not hypoxic.  EKG without acute changes.  Chest x-ray without any acute problems.  We will treat with albuterol, prednisone.  Follow-up with PCP.  Will test for COVID, patient declines waiting for results and does not feel like she would want Paxlovid even if positive.  Final Clinical Impression(s) / ED Diagnoses Final diagnoses:  Bronchitis    Rx / DC Orders ED Discharge Orders          Ordered    predniSONE (DELTASONE) 20 MG tablet  Daily with breakfast        07/24/20 0120             Gilda Crease, MD 07/24/20 0120

## 2020-07-24 NOTE — ED Notes (Signed)
RT educated pt on proper use of MDI w/aero chamber. Pt able to perform task w/out difficulty and properly. RT educated pt on how medication works and when to use. Pt able to confirm understanding to RT.

## 2021-04-22 ENCOUNTER — Encounter (HOSPITAL_BASED_OUTPATIENT_CLINIC_OR_DEPARTMENT_OTHER): Payer: Self-pay | Admitting: Emergency Medicine

## 2021-04-22 ENCOUNTER — Other Ambulatory Visit: Payer: Self-pay

## 2021-04-22 DIAGNOSIS — X58XXXS Exposure to other specified factors, sequela: Secondary | ICD-10-CM | POA: Diagnosis not present

## 2021-04-22 DIAGNOSIS — M1711 Unilateral primary osteoarthritis, right knee: Secondary | ICD-10-CM | POA: Diagnosis not present

## 2021-04-22 DIAGNOSIS — S8991XS Unspecified injury of right lower leg, sequela: Secondary | ICD-10-CM | POA: Diagnosis present

## 2021-04-22 DIAGNOSIS — S83011S Lateral subluxation of right patella, sequela: Secondary | ICD-10-CM | POA: Insufficient documentation

## 2021-04-22 DIAGNOSIS — M5431 Sciatica, right side: Secondary | ICD-10-CM | POA: Diagnosis not present

## 2021-04-22 NOTE — ED Triage Notes (Signed)
Patient reports lower back pain with pain radiating into her buttocks and right leg.  Patient also reports swelling in her right knee.  Patient has had an MRI in January and is currently waiting to see a pain specialist.  ?

## 2021-04-23 ENCOUNTER — Emergency Department (HOSPITAL_BASED_OUTPATIENT_CLINIC_OR_DEPARTMENT_OTHER): Payer: BC Managed Care – PPO | Admitting: Radiology

## 2021-04-23 ENCOUNTER — Emergency Department (HOSPITAL_BASED_OUTPATIENT_CLINIC_OR_DEPARTMENT_OTHER): Payer: BC Managed Care – PPO

## 2021-04-23 ENCOUNTER — Encounter (HOSPITAL_BASED_OUTPATIENT_CLINIC_OR_DEPARTMENT_OTHER): Payer: Self-pay | Admitting: Emergency Medicine

## 2021-04-23 ENCOUNTER — Emergency Department (HOSPITAL_BASED_OUTPATIENT_CLINIC_OR_DEPARTMENT_OTHER)
Admission: EM | Admit: 2021-04-23 | Discharge: 2021-04-23 | Disposition: A | Discharge status: Home / Self Care | Payer: BC Managed Care – PPO | Attending: Emergency Medicine | Admitting: Emergency Medicine

## 2021-04-23 DIAGNOSIS — M5431 Sciatica, right side: Secondary | ICD-10-CM

## 2021-04-23 DIAGNOSIS — M1710 Unilateral primary osteoarthritis, unspecified knee: Secondary | ICD-10-CM

## 2021-04-23 DIAGNOSIS — S83011S Lateral subluxation of right patella, sequela: Secondary | ICD-10-CM

## 2021-04-23 LAB — PREGNANCY, URINE: Preg Test, Ur: NEGATIVE

## 2021-04-23 MED ORDER — KETOROLAC TROMETHAMINE 60 MG/2ML IM SOLN
60.0000 mg | Freq: Once | INTRAMUSCULAR | Status: AC
  Administered 2021-04-23: 60 mg via INTRAMUSCULAR
  Filled 2021-04-23: qty 2

## 2021-04-23 MED ORDER — IBUPROFEN 800 MG PO TABS: 800.0000 mg | ORAL_TABLET | Freq: Three times a day (TID) | ORAL | 0 refills | Status: DC

## 2021-04-23 MED ORDER — LIDOCAINE 5 % EX PTCH
3.0000 | MEDICATED_PATCH | CUTANEOUS | Status: DC
  Administered 2021-04-23: 3 via TRANSDERMAL
  Filled 2021-04-23: qty 3

## 2021-04-23 MED ORDER — DEXAMETHASONE SODIUM PHOSPHATE 10 MG/ML IJ SOLN
10.0000 mg | Freq: Once | INTRAMUSCULAR | Status: AC
  Administered 2021-04-23: 10 mg via INTRAMUSCULAR
  Filled 2021-04-23: qty 1

## 2021-04-23 MED ORDER — DICLOFENAC SODIUM ER 100 MG PO TB24: 100.0000 mg | ORAL_TABLET | Freq: Every day | ORAL | 0 refills | Status: DC

## 2021-04-23 MED ORDER — LIDOCAINE 5 % EX PTCH: 1.0000 | MEDICATED_PATCH | CUTANEOUS | 0 refills | Status: AC

## 2021-04-23 MED ORDER — LIDOCAINE 5 % EX PTCH: 1.0000 | MEDICATED_PATCH | CUTANEOUS | 0 refills | Status: DC

## 2021-04-23 NOTE — ED Provider Notes (Signed)
?[facility] EMERGENCY DEPT ?Provider Note ? ? ?CSN: [id number] ?Arrival date & time: 04/22/21  2233 ? ?  ? ?History ? ?Chief Complaint  ?Patient presents with  ? Back Pain  ? ? ?[name] is a 51 y.o. female. ? ?The history is provided by the patient.  ?Back Pain ?Location:  Gluteal region ?Quality:  Cramping ?Radiates to:  R posterior upper leg ?Pain severity:  Moderate ?Pain is:  Same all the time ?Onset quality:  Gradual ?Duration: months. ?Timing:  Constant ?Progression:  Worsening ?Chronicity:  Chronic ?Context: not MCA, not MVA, not recent illness and not recent injury   ?Relieved by:  Nothing ?Worsened by:  Nothing ?Ineffective treatments:  None tried ?Associated symptoms: no chest pain, no dysuria, no fever, no numbness, no paresthesias, no perianal numbness, no tingling, no weakness and no weight loss   ?Risk factors: no hx of cancer   ?Chronic low back pain with chronic knee pain with a "lazy knee cap".   ?  ? ?Home Medications ?Prior to Admission medications   ?Medication Sig Start Date End Date Taking? Authorizing Provider  ?ibuprofen (ADVIL) 800 MG tablet Take 1 tablet (800 mg total) by mouth 3 (three) times daily. 04/23/21  Yes Buck Mcaffee, MD  ?lidocaine (LIDODERM) 5 % Place 1 patch onto the skin daily. Remove & Discard patch within 12 hours or as directed by MD 04/23/21  Yes Bryndan Bilyk, MD  ?lidocaine (LIDODERM) 5 % Place 1 patch onto the skin daily. Remove & Discard patch within 12 hours or as directed by MD 04/23/21  Yes Kimberlly Norgard, MD  ?celecoxib (CELEBREX) 200 MG capsule Take 1 capsule (200 mg total) by mouth 2 (two) times daily. 07/15/19   Wallis BambergMani, Mario, PA-C  ?cephALEXin (KEFLEX) 500 MG capsule Take 1 capsule (500 mg total) by mouth 2 (two) times daily. 07/13/19   Belinda FisherYu, Amy V, PA-C  ?Cholecalciferol 1.25 MG (50000 UT) TABS Take 1 weekly 04/11/19   Corinna CapraBrown, Angel A, DO  ?fluconazole (DIFLUCAN) 150 MG tablet Take 1 tablet (150 mg total) by mouth daily. Take second dose 72 hours  later if symptoms still persists. 07/13/19   Cathie HoopsYu, Amy V, PA-C  ?hydrochlorothiazide (HYDRODIURIL) 25 MG tablet Take 25 tablets by mouth daily. 03/25/16   [provider]  ?potassium chloride (KLOR-CON) 10 MEQ tablet Take 1 tablet (10 mEq total) by mouth daily. 04/11/19   Roswell NickelBrown, Angel A, DO  ?predniSONE (DELTASONE) 20 MG tablet Take 2 tablets (40 mg total) by mouth daily with breakfast. 07/24/20   Pollina, Canary Brimhristopher J, MD  ?tiZANidine (ZANAFLEX) 2 MG tablet Take 1 tablet (2 mg total) by mouth every 8 (eight) hours as needed for muscle spasms. 07/13/19   Cathie HoopsYu, Amy V, PA-C  ?triamcinolone cream (KENALOG) 0.1 % Apply 1 application topically daily. 05/06/16   [provider]  ?buPROPion (WELLBUTRIN SR) 150 MG 12 hr tablet Take 1 tablet (150 mg total) by mouth daily. 04/11/19 07/13/19  [place] NickelBrown, Angel A, DO  ?   ? ?Allergies    ?Escitalopram, Escitalopram oxalate, and Topiramate   ? ?Review of Systems   ?Review of Systems  ?Constitutional:  Negative for fever and weight loss.  ?HENT:  Negative for congestion.   ?Eyes:  Negative for redness.  ?Respiratory:  Negative for shortness of breath, wheezing and stridor.   ?Cardiovascular:  Negative for chest pain.  ?Gastrointestinal:  Negative for vomiting.  ?Genitourinary:  Negative for dysuria.  ?Musculoskeletal:  Positive for arthralgias and back pain.  ?Neurological:  Negative for tingling, weakness, numbness and paresthesias.  ?Psychiatric/Behavioral:  Negative for agitation.   ?All other systems reviewed and are negative. ? ?Physical Exam ?Updated Vital Signs ?BP 96/61   Pulse 77   Temp 98.3 ?F (36.8 ?C) (Oral)   Resp 17   Ht 5\' 9"  (1.753 m)   Wt (!) 145.2 kg   SpO2 97%   BMI 47.26 kg/m?  ?Physical Exam ?Vitals and nursing note reviewed.  ?Constitutional:   ?   General: She is not in acute distress. ?   Appearance: Normal appearance.  ?HENT:  ?   Head: Normocephalic and atraumatic.  ?   Nose: Nose normal.  ?Eyes:  ?   Conjunctiva/sclera: Conjunctivae normal.  ?    Pupils: Pupils are equal, round, and reactive to light.  ?Cardiovascular:  ?   Rate and Rhythm: Normal rate and regular rhythm.  ?   Pulses: Normal pulses.  ?   Heart sounds: Normal heart sounds.  ?Pulmonary:  ?   Effort: Pulmonary effort is normal.  ?   Breath sounds: Normal breath sounds.  ?Abdominal:  ?   General: Bowel sounds are normal.  ?   Palpations: Abdomen is soft.  ?   Tenderness: There is no abdominal tenderness. There is no guarding.  ?Musculoskeletal:  ?   Cervical back: Normal, normal range of motion and neck supple.  ?   Thoracic back: Normal.  ?   Lumbar back: Normal.  ?   Right knee: No effusion, erythema, ecchymosis, bony tenderness or crepitus. No LCL laxity, MCL laxity, ACL laxity or PCL laxity. Normal pulse.  ?   Left knee: No bony tenderness or crepitus. No LCL laxity, MCL laxity, ACL laxity or PCL laxity.Normal pulse.  ?   Right lower leg: No bony tenderness.  ?   Left lower leg: No bony tenderness.  ?   Right ankle: Normal.  ?   Right Achilles Tendon: Normal.  ?   Left ankle: Normal.  ?   Left Achilles Tendon: Normal.  ?     Legs: ? ?Skin: ?   General: Skin is warm and dry.  ?   Capillary Refill: Capillary refill takes less than 2 seconds.  ?Neurological:  ?   General: No focal deficit present.  ?   Mental Status: She is alert and oriented to person, place, and time.  ?   Deep Tendon Reflexes: Reflexes normal.  ?Psychiatric:     ?   Mood and Affect: Mood normal.     ?   Behavior: Behavior normal.  ? ? ?ED Results / Procedures / Treatments   ?Labs ?(all labs ordered are listed, but only abnormal results are displayed) ?Labs Reviewed  ?PREGNANCY, URINE  ? ? ?EKG ?None ? ?Radiology ?CT Knee Right Wo Contrast ? ?Result Date: 04/23/2021 ?CLINICAL DATA:  Suspect lateral patellar dislocation. EXAM: CT OF THE RIGHT KNEE WITHOUT CONTRAST TECHNIQUE: Multidetector CT imaging of the right knee was performed according to the standard protocol. Multiplanar CT image reconstructions were also generated.  RADIATION DOSE REDUCTION: This exam was performed according to the departmental dose-optimization program which includes automated exposure control, adjustment of the mA and/or kV according to patient size and/or use of iterative reconstruction technique. COMPARISON:  Right knee series earlier today. FINDINGS: Bones/Joint/Cartilage The patella is subluxed laterally such that the interfacetal ridge is perched on top of the anterior mid aspect of the lateral femoral condyle. Elsewhere the osseous alignment is physiologic. There is mild femorotibial joint narrowing and  marginal osteophytic spurring, subcortical cystic changes along the lateral patellar facet associated with cartilage loss. The bone mineralization is normal. No fracture is evident. There is enthesopathic spurring of the anterior patella. Coronal reconstructions appear to demonstrate at least mild outward extrusion of the menisci. Whether or not they're torn is indeterminate on CT. Ligaments Suboptimally assessed by CT. There is likely disruption of the medial patellofemoral ligament however, or at least severe thinning of it with laxity noted in the medial patellar retinaculum. Muscles and Tendons Also not well evaluated with noncontrast CT. There is partial fatty atrophy in medial head of the gastrocnemius muscle. Muscle bulk is otherwise preserved. There is laxity of the patellar tendon but the quadriceps and patellar tendons both appear grossly intact as far seen. Other regional tendons grossly intact. Soft tissues There is edema anteriorly. There is moderate suprapatellar bursal fluid greater laterally. The fluid is low in density. There are calcifications in the popliteal artery. IMPRESSION: 1. Lateral patellar subluxation with the interfacetal ridge sitting on top of the anterior aspect of the lateral femoral condyle with evidence of chronic cartilage loss and subcortical cystic changes along the lateral patellar facet. 2. Likely disrupted or  least severely thinned medial patellofemoral ligament. Remainder of the MCL complex also appears thinned out. There is laxity of the medial patellar retinaculum. 3. On coronal reformatting, the menisci appear at least s

## 2021-05-07 ENCOUNTER — Emergency Department (HOSPITAL_BASED_OUTPATIENT_CLINIC_OR_DEPARTMENT_OTHER)
Admission: EM | Admit: 2021-05-07 | Discharge: 2021-05-07 | Disposition: A | Discharge status: Home / Self Care | Payer: BC Managed Care – PPO | Attending: Emergency Medicine | Admitting: Emergency Medicine

## 2021-05-07 ENCOUNTER — Other Ambulatory Visit: Payer: Self-pay

## 2021-05-07 ENCOUNTER — Encounter (HOSPITAL_BASED_OUTPATIENT_CLINIC_OR_DEPARTMENT_OTHER): Payer: Self-pay | Admitting: Emergency Medicine

## 2021-05-07 DIAGNOSIS — M545 Low back pain, unspecified: Secondary | ICD-10-CM | POA: Diagnosis present

## 2021-05-07 DIAGNOSIS — M5441 Lumbago with sciatica, right side: Secondary | ICD-10-CM | POA: Insufficient documentation

## 2021-05-07 DIAGNOSIS — M5431 Sciatica, right side: Secondary | ICD-10-CM

## 2021-05-07 MED ORDER — KETOROLAC TROMETHAMINE 30 MG/ML IJ SOLN
30.0000 mg | Freq: Once | INTRAMUSCULAR | Status: DC
  Filled 2021-05-07: qty 1

## 2021-05-07 MED ORDER — PREDNISONE 50 MG PO TABS
60.0000 mg | ORAL_TABLET | Freq: Once | ORAL | Status: AC
  Administered 2021-05-07: 60 mg via ORAL
  Filled 2021-05-07: qty 1

## 2021-05-07 MED ORDER — PREDNISONE 20 MG PO TABS: 20.0000 mg | ORAL_TABLET | Freq: Every day | ORAL | 0 refills | Status: AC

## 2021-05-07 MED ORDER — IBUPROFEN 600 MG PO TABS: 600.0000 mg | ORAL_TABLET | Freq: Four times a day (QID) | ORAL | 0 refills | Status: DC | PRN

## 2021-05-07 MED ORDER — KETOROLAC TROMETHAMINE 30 MG/ML IJ SOLN
30.0000 mg | Freq: Once | INTRAMUSCULAR | Status: AC
  Administered 2021-05-07: 30 mg via INTRAMUSCULAR

## 2021-05-07 MED ORDER — OXYCODONE HCL 5 MG PO TABS: 5.0000 mg | ORAL_TABLET | Freq: Every evening | ORAL | 0 refills | Status: AC

## 2021-05-07 NOTE — ED Provider Notes (Signed)
?[facility] EM[facility]PT ?Provider Note ? ? ?CSN: [id number] ?Arrival date & time: 05/07/21  [id number] ? ?  ? ?History ? ?Chief Complaint  ?Patient presents with  ? Back Pain  ? ? ?[name] is a 51 y.o. female. ? ?Patient is a 51 year old female presenting for complaints of lower back pain that radiates to the right buttocks, right leg, and stopping just before the right knee.  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  Patient states over the last 2 months the pain has been difficult to control with meloxicam.  States she was resting in bed tonight and had difficulty getting up to go to the bathroom due to pain.  Denies any sensation or motor deficits.  Denies any recent falls or repeat injuries.  Denies numbness or tingling in the upper middle thighs or bowel or urinary incontinence. ? ?The history is provided by the patient. No language interpreter was used.  ?Back Pain ?Associated symptoms: no abdominal pain, no chest pain, no dysuria and no fever   ? ?  ? ?Home Medications ?Prior to Admission medications   ?Medication Sig Start Date End Date Taking? Authorizing Provider  ?ibuprofen (ADVIL) 600 MG tablet Take 1 tablet (600 mg total) by mouth every 6 (six) hours as needed for moderate pain. 05/07/21  Yes Edwin DadaGray, Brittlyn Cloe P, DO  ?predniSONE (DELTASONE) 20 MG tablet Take 1 tablet (20 mg total) by mouth daily for 5 days. 05/07/21 05/12/21 Yes Edwin DadaGray, Caitlynne Harbeck P, DO  ?celecoxib (CELEBREX) 200 MG capsule Take 1 capsule (200 mg total) by mouth 2 (two) times daily. 07/15/19   Wallis BambergMani, Mario, PA-C  ?cephALEXin (KEFLEX) 500 MG capsule Take 1 capsule (500 mg total) by mouth 2 (two) times daily. 07/13/19   Belinda FisherYu, Amy V, PA-C  ?Cholecalciferol 1.25 MG (50000 UT) TABS Take 1 weekly 04/11/19    Corinna CapraBrown, Angel A, DO  ?fluconazole (DIFLUCAN) 150 MG tablet Take 1 tablet (150 mg total) by mouth daily. Take second dose 72 hours later if symptoms still persists. 07/13/19   Cathie HoopsYu, Amy V, PA-C  ?hydrochlorothiazide (HYDRODIURIL) 25 MG tablet Take 25 tablets by mouth daily. 03/25/16   [provider]  ?lidocaine (LIDODERM) 5 % Place 1 patch onto the skin daily. Remove & Discard patch within 12 hours or as directed by MD 04/23/21   Nicanor AlconPalumbo, April, MD  ?lidocaine (LIDODERM) 5 % Place 1 patch onto the skin daily. Remove & Discard patch within 12 hours or as directed by MD 04/23/21   Nicanor AlconPalumbo, April, MD  ?potassium chloride (KLOR-CON) 10 MEQ tablet Take 1 tablet (10 mEq total) by mouth daily. 04/11/19   Corinna CapraBrown, Angel A, DO  ?tiZANidine (ZANAFLEX) 2 MG tablet Take 1 tablet (2 mg total) by mouth every 8 (eight) hours as needed for muscle spasms. 07/13/19   Cathie HoopsYu, Amy V, PA-C  ?triamcinolone cream (KENALOG) 0.1 % Apply 1 application topically daily. 05/06/16   [provider]  ?buPROPion (WELLBUTRIN SR) 150 MG 12 hr tablet Take 1 tablet (150 mg total) by mouth daily. 04/11/19 07/13/19  [place] NickelBrown, Angel A, DO  ?   ? ?Allergies    ?Escitalopram, Escitalopram oxalate, and Topiramate   ? ?Review of Systems   ?Review of Systems  ?Constitutional:  Negative for chills and fever.  ?  HENT:  Negative for ear pain and sore throat.   ?Eyes:  Negative for pain and visual disturbance.  ?Respiratory:  Negative for cough and shortness of breath.   ?Cardiovascular:  Negative for chest pain and palpitations.  ?Gastrointestinal:  Negative for abdominal pain and vomiting.  ?Genitourinary:  Negative for dysuria and hematuria.  ?Musculoskeletal:  Positive for back pain. Negative for arthralgias.  ?Skin:  Negative for color change and rash.  ?Neurological:  Negative for seizures and syncope.  ?All other systems reviewed and are negative. ? ?Physical Exam ?Updated Vital Signs ?BP (!) 150/86 (BP Location: Right Arm)   Pulse 80   Temp 98.3 ?F (36.8 ?C)  (Oral)   Resp 18   LMP 04/18/2021   SpO2 97%  ?Physical Exam ?Vitals and nursing note reviewed.  ?Constitutional:   ?   General: She is not in acute distress. ?   Appearance: She is well-developed.  ?HENT:  ?   Head: Normocephalic and atraumatic.  ?Eyes:  ?   Conjunctiva/sclera: Conjunctivae normal.  ?Cardiovascular:  ?   Rate and Rhythm: Normal rate and regular rhythm.  ?   Heart sounds: No murmur heard. ?Pulmonary:  ?   Effort: Pulmonary effort is normal. No respiratory distress.  ?   Breath sounds: Normal breath sounds.  ?Abdominal:  ?   Palpations: Abdomen is soft.  ?   Tenderness: There is no abdominal tenderness.  ?Musculoskeletal:     ?   General: No swelling.  ?   Cervical back: Neck supple. No bony tenderness.  ?   Thoracic back: No bony tenderness.  ?   Lumbar back: Bony tenderness present.  ?Skin: ?   General: Skin is warm and dry.  ?   Capillary Refill: Capillary refill takes less than 2 seconds.  ?Neurological:  ?   Mental Status: She is alert and oriented to person, place, and time.  ?   GCS: GCS eye subscore is 4. GCS verbal subscore is 5. GCS motor subscore is 6.  ?   Sensory: Sensation is intact.  ?   Motor: Motor function is intact.  ?Psychiatric:     ?   Mood and Affect: Mood normal.  ? ? ?ED Results / Procedures / Treatments   ?Labs ?(all labs ordered are listed, but only abnormal results are displayed) ?Labs Reviewed - No data to display ? ?EKG ?None ? ?Radiology ?No results found. ? ?Procedures ?Procedures  ? ? ?Medications Ordered in ED ?Medications  ?predniSONE (DELTASONE) tablet 60 mg (60 mg Oral Given 05/07/21 0406)  ?ketorolac (TORADOL) 30 MG/ML injection 30 mg (30 mg Intramuscular Given 05/07/21 0406)  ? ? ?ED Course/ Medical Decision Making/ A&P ?  ?                        ?Medical Decision Making ?Risk ?Prescription drug management. ? ? ?51 year old female presenting for complaints of lower back pain that radiates to the right buttocks, right leg, and stopping just before the right  knee.  No signs or symptoms of cauda equina syndrome.  Patient neurovascularly intact.  No concerning signs or symptoms for cauda equina syndrome, malignancy, or spinal epidural abscess.  Patient's pain secondary to sciatica.  Patient previously had x-rays at time of injury demonstrating degenerative disc disease only.  Steroids and pain control given in ED. ? ?Patient in no distress and overall condition improved here in the ED. Detailed discussions were had with the patient regarding current findings, and  need for close f/u with PCP or on call doctor. The patient has been instructed to return immediately if the symptoms worsen in any way for re-evaluation. Patient verbalized understanding and is in agreement with current care plan. All questions answered prior to discharge. ? ? ? ? ? ? ? ? ?Final Clinical Impression(s) / ED Diagnoses ?Final diagnoses:  ?Sciatica of right side  ? ? ?Rx / DC Orders ?ED Discharge Orders   ? ?      Ordered  ?  ibuprofen (ADVIL) 600 MG tablet  Every 6 hours PRN       ? 05/07/21 0402  ?  predniSONE (DELTASONE) 20 MG tablet  Daily       ? 05/07/21 0402  ? ?  ?  ? ?  ? ? ?  ?Franne Forts, DO ?05/08/21 0349 ? ?

## 2021-05-07 NOTE — ED Triage Notes (Signed)
Pt reports back pain radiating from buttocks to R leg and swelling in R knee. Reports cannot get in to primary doctors to help due to workers comp issues.  ?

## 2021-05-07 NOTE — ED Provider Notes (Incomplete)
?[facility] DEPT ?Provider Note ? ? ?CSN: [id number] ?Arrival date & time: 05/07/21  [id number] ? ?  ? ?History ?{Add pertinent medical, surgical, social history, OB history to HPI:1} ?Chief Complaint  ?Patient presents with  ? Back Pain  ? ? ?[name] is a 51 y.o. female. ? ? ?Back Pain ? ?  ? ?Home Medications ?Prior to Admission medications   ?Medication Sig Start Date End Date Taking? Authorizing Provider  ?celecoxib (CELEBREX) 200 MG capsule Take 1 capsule (200 mg total) by mouth 2 (two) times daily. 07/15/19   Jaynee Eagles, PA-C  ?cephALEXin (KEFLEX) 500 MG capsule Take 1 capsule (500 mg total) by mouth 2 (two) times daily. 07/13/19   Ok Edwards, PA-C  ?Cholecalciferol 1.25 MG (50000 UT) TABS Take 1 weekly 04/11/19   Jearld Lesch A, DO  ?fluconazole (DIFLUCAN) 150 MG tablet Take 1 tablet (150 mg total) by mouth daily. Take second dose 72 hours later if symptoms still persists. 07/13/19   Tasia Catchings, Amy V, PA-C  ?hydrochlorothiazide (HYDRODIURIL) 25 MG tablet Take 25 tablets by mouth daily. 03/25/16   [provider]  ?ibuprofen (ADVIL) 800 MG tablet Take 1 tablet (800 mg total) by mouth 3 (three) times daily. 04/23/21   Palumbo, April, MD  ?lidocaine (LIDODERM) 5 % Place 1 patch onto the skin daily. Remove & Discard patch within 12 hours or as directed by MD 04/23/21   Randal Buba, April, MD  ?lidocaine (LIDODERM) 5 % Place 1 patch onto the skin daily. Remove & Discard patch within 12 hours or as directed by MD 04/23/21   Randal Buba, April, MD  ?potassium chloride (KLOR-CON) 10 MEQ tablet Take 1 tablet (10 mEq total) by mouth daily. 04/11/19   Georgia Lopes, DO  ?predniSONE (DELTASONE) 20 MG tablet Take 2 tablets (40 mg total) by mouth daily with breakfast. 07/24/20   Pollina, Gwenyth Allegra, MD  ?tiZANidine (ZANAFLEX) 2 MG tablet Take 1 tablet (2 mg total) by mouth every 8 (eight) hours as needed for muscle spasms. 07/13/19   Tasia Catchings, Amy V, PA-C  ?triamcinolone cream (KENALOG) 0.1 % Apply 1 application  topically daily. 05/06/16   [provider]  ?buPROPion (WELLBUTRIN SR) 150 MG 12 hr tablet Take 1 tablet (150 mg total) by mouth daily. 04/11/19 07/13/19  Georgia Lopes, DO  ?   ? ?Allergies    ?Escitalopram, Escitalopram oxalate, and Topiramate   ? ?Review of Systems   ?Review of Systems  ?Musculoskeletal:  Positive for back pain.  ? ?Physical Exam ?Updated Vital Signs ?BP (!) 150/86 (BP Location: Right Arm)   Pulse 80   Temp 98.3 ?F (36.8 ?C) (Oral)   Resp 18   LMP 04/18/2021   SpO2 97%  ?Physical Exam ? ?ED Results / Procedures / Treatments   ?Labs ?(all labs ordered are listed, but only abnormal results are displayed) ?Labs Reviewed - No data to display ? ?EKG ?None ? ?Radiology ?No results found. ? ?Procedures ?Procedures  ?{Document cardiac monitor, telemetry assessment procedure when appropriate:1} ? ?Medications Ordered in ED ?Medications - No data to display ? ?ED Course/ Medical Decision Making/ A&P ?  ?                        ?Medical Decision Making ? ?*** ? ?{Document critical care time when appropriate:1} ?{Document review of labs and clinical decision tools ie heart score, Chads2Vasc2 etc:1}  ?{Document your independent review of radiology images, and any outside records:1} ?{  Document your discussion with family members, caretakers, and with consultants:1} ?{Document social determinants of health affecting pt's care:1} ?{Document your decision making why or why not admission, treatments were needed:1} ?Final Clinical Impression(s) / ED Diagnoses ?Final diagnoses:  ?None  ? ? ?Rx / DC Orders ?ED Discharge Orders   ? ? None  ? ?  ? ? ?

## 2021-07-18 ENCOUNTER — Emergency Department (HOSPITAL_BASED_OUTPATIENT_CLINIC_OR_DEPARTMENT_OTHER)
Admission: EM | Admit: 2021-07-18 | Discharge: 2021-07-18 | Disposition: A | Discharge status: Home / Self Care | Payer: BC Managed Care – PPO | Attending: Emergency Medicine | Admitting: Emergency Medicine

## 2021-07-18 ENCOUNTER — Encounter (HOSPITAL_BASED_OUTPATIENT_CLINIC_OR_DEPARTMENT_OTHER): Payer: Self-pay

## 2021-07-18 ENCOUNTER — Other Ambulatory Visit: Payer: Self-pay

## 2021-07-18 DIAGNOSIS — J069 Acute upper respiratory infection, unspecified: Secondary | ICD-10-CM | POA: Insufficient documentation

## 2021-07-18 DIAGNOSIS — M5441 Lumbago with sciatica, right side: Secondary | ICD-10-CM | POA: Diagnosis not present

## 2021-07-18 DIAGNOSIS — M549 Dorsalgia, unspecified: Secondary | ICD-10-CM

## 2021-07-18 DIAGNOSIS — M545 Low back pain, unspecified: Secondary | ICD-10-CM | POA: Diagnosis present

## 2021-07-18 LAB — GROUP A STREP BY PCR: Group A Strep by PCR: NOT DETECTED

## 2021-07-18 MED ORDER — PREDNISONE 10 MG (21) PO TBPK: ORAL_TABLET | Freq: Every day | ORAL | 0 refills | Status: DC

## 2021-07-18 MED ORDER — DEXAMETHASONE SODIUM PHOSPHATE 10 MG/ML IJ SOLN: 10.0000 mg | Freq: Once | INTRAMUSCULAR | Status: DC

## 2021-07-18 MED ORDER — DEXAMETHASONE SODIUM PHOSPHATE 10 MG/ML IJ SOLN
10.0000 mg | Freq: Once | INTRAMUSCULAR | Status: AC
Start: 2021-07-18 — End: 2021-07-18
  Administered 2021-07-18: 10 mg via INTRAMUSCULAR
  Filled 2021-07-18: qty 1

## 2021-07-18 MED ORDER — IBUPROFEN 600 MG PO TABS: 600.0000 mg | ORAL_TABLET | Freq: Four times a day (QID) | ORAL | 0 refills | Status: AC | PRN

## 2021-07-18 MED ORDER — KETOROLAC TROMETHAMINE 30 MG/ML IJ SOLN
30.0000 mg | Freq: Once | INTRAMUSCULAR | Status: AC
  Administered 2021-07-18: 30 mg via INTRAMUSCULAR
  Filled 2021-07-18: qty 1

## 2021-07-18 NOTE — ED Provider Notes (Signed)
[facility] EMERGENC[facility] DEPT Provider Note   CSN: [id number] Arrival date & time: 07/18/21  1550     History  Chief Complaint  Patient presents with   Sore Throat   Back Pain    [name] is a 51 y.o. female.  Patient presents to the hospital with chief complaint of back pain with right-sided radicular symptoms.  Patient has a history of the same.  Patient was mostly recently seen in the emergency department in April of this year.  Patient reportedly had injury at work in January of this year.  Her pain is in the lower back and radiates to the right buttocks, right leg, and stops just before the right knee.  Due to issues with Worker's Comp. she has been having issues getting care as needed.  She states that over the past few days her pain is increased.  She denies any sensation, motor deficits.  Denies any recent fall or trauma.  Denies numbness or tingling in the saddle region or any incontinence. Patient also complains of symptoms consistent with upper respiratory infection including sore throat, headache, congestion, mild cough.  HPI     Home Medications Prior to Admission medications   Medication Sig Start Date End Date Taking? Authorizing Provider  ibuprofen (ADVIL) 600 MG tablet Take 1 tablet (600 mg total) by mouth every 6 (six) hours as needed. 07/18/21  Yes Dorothyann Peng, PA-C  predniSONE (STERAPRED UNI-PAK 21 TAB) 10 MG (21) TBPK tablet Take by mouth daily. Take 6 tabs by mouth daily  for 2 days, then 5 tabs for 2 days, then 4 tabs for 2 days, then 3 tabs for 2 days, 2 tabs for 2 days, then 1 tab by mouth daily for 2 days 07/18/21  Yes Dorothyann Peng, PA-C  celecoxib (CELEBREX) 200 MG capsule Take 1 capsule (200 mg total) by mouth 2 (two) times daily. 07/15/19   Jaynee Eagles, PA-C  cephALEXin (KEFLEX) 500 MG capsule Take 1 capsule (500 mg total) by mouth 2 (two) times daily. 07/13/19   Ok Edwards, PA-C  Cholecalciferol 1.25 MG (50000 UT) TABS Take 1 weekly  04/11/19   Jearld Lesch A, DO  fluconazole (DIFLUCAN) 150 MG tablet Take 1 tablet (150 mg total) by mouth daily. Take second dose 72 hours later if symptoms still persists. 07/13/19   Tasia Catchings, Amy V, PA-C  hydrochlorothiazide (HYDRODIURIL) 25 MG tablet Take 25 tablets by mouth daily. 03/25/16   [provider]  lidocaine (LIDODERM) 5 % Place 1 patch onto the skin daily. Remove & Discard patch within 12 hours or as directed by MD 04/23/21   Palumbo, April, MD  lidocaine (LIDODERM) 5 % Place 1 patch onto the skin daily. Remove & Discard patch within 12 hours or as directed by MD 04/23/21   Randal Buba, April, MD  potassium chloride (KLOR-CON) 10 MEQ tablet Take 1 tablet (10 mEq total) by mouth daily. 04/11/19   Jearld Lesch A, DO  tiZANidine (ZANAFLEX) 2 MG tablet Take 1 tablet (2 mg total) by mouth every 8 (eight) hours as needed for muscle spasms. 07/13/19   Tasia Catchings, Amy V, PA-C  triamcinolone cream (KENALOG) 0.1 % Apply 1 application topically daily. 05/06/16   [provider]  buPROPion (WELLBUTRIN SR) 150 MG 12 hr tablet Take 1 tablet (150 mg total) by mouth daily. 04/11/19 07/13/19  Jearld Lesch A, DO      Allergies    Escitalopram, Escitalopram oxalate, and Topiramate    Review of Systems  Review of Systems  Constitutional:  Negative for fever.  HENT:  Positive for sore throat.   Respiratory:  Positive for cough. Negative for shortness of breath.   Cardiovascular:  Negative for chest pain.  Gastrointestinal:  Negative for abdominal pain and nausea.  Genitourinary:  Negative for dysuria.  Musculoskeletal:  Positive for back pain.  Neurological:  Positive for headaches.    Physical Exam Updated Vital Signs BP (!) 173/85 (BP Location: Right Wrist)   Pulse 74   Temp 98 F (36.7 C) (Oral)   Resp 16   Ht 5\' 9"  (1.753 m)   Wt (!) 145.2 kg   SpO2 100%   BMI 47.27 kg/m  Physical Exam Vitals and nursing note reviewed.  Constitutional:      General: She is not in acute distress.     Appearance: She is obese.  HENT:     Head: Normocephalic and atraumatic.     Mouth/Throat:     Mouth: Mucous membranes are moist.     Pharynx: Uvula midline. Posterior oropharyngeal erythema present.  Eyes:     Conjunctiva/sclera: Conjunctivae normal.  Cardiovascular:     Rate and Rhythm: Normal rate and regular rhythm.     Heart sounds: Normal heart sounds.  Pulmonary:     Effort: Pulmonary effort is normal.     Breath sounds: Normal breath sounds.  Abdominal:     Palpations: Abdomen is soft.     Tenderness: There is no abdominal tenderness.  Musculoskeletal:     Cervical back: Normal range of motion and neck supple.  Skin:    General: Skin is warm and dry.     Capillary Refill: Capillary refill takes less than 2 seconds.  Neurological:     Mental Status: She is alert.     ED Results / Procedures / Treatments   Labs (all labs ordered are listed, but only abnormal results are displayed) Labs Reviewed  GROUP A STREP BY PCR    EKG None  Radiology No results found.  Procedures Procedures    Medications Ordered in ED Medications  ketorolac (TORADOL) 30 MG/ML injection 30 mg (30 mg Intramuscular Given 07/18/21 1922)  dexamethasone (DECADRON) injection 10 mg (10 mg Intramuscular Given 07/18/21 1922)    ED Course/ Medical Decision Making/ A&P                           Medical Decision Making Risk Prescription drug management.   Patient presents with a chief complaint of low back pain with radiation to the right leg.  This is not a new complaint.  This seems to be an exacerbation of her previous low back injuries.  No signs or symptoms of cauda equina.  She is neurovascularly intact.  Patient has previously had x-rays showing degenerative disc disease in that area.  Steroids and anti-inflammatories given while here in the hospital.  Patient has began to feel much better.  Plan to discharge patient home at this time with steroid Dosepak and ibuprofen tablets.  Patient  states that her Worker's Comp. issues have been resolved and that she is going to see pain management starting on 17 July.  I did order a group A strep test for the patient which was negative.  The patient's symptoms are consistent with upper respiratory infection.  She has no fever or concerning symptoms at this time.  The patient states that she is much less concerned about those and really was concerned about  the back pain at this time.  Patient may use over-the-counter pain medication and cold medication to help with symptoms.  If her condition worsens she should seek reevaluation        Final Clinical Impression(s) / ED Diagnoses Final diagnoses:  Back pain with sciatica  Upper respiratory tract infection, unspecified type    Rx / DC Orders ED Discharge Orders          Ordered    predniSONE (STERAPRED UNI-PAK 21 TAB) 10 MG (21) TBPK tablet  Daily        07/18/21 1954    ibuprofen (ADVIL) 600 MG tablet  Every 6 hours PRN        07/18/21 1954              Pamala Duffel 07/18/21 Dorian Furnace, MD 07/19/21 1557

## 2021-07-18 NOTE — ED Triage Notes (Signed)
Patient here POV from Home.  Endorses Back Pain that began 2 Days PTA. Radiates to Right Leg. History of Same. Also states Yesterday she began having URI Symptoms including Sore Throat, Headache, Congestion, Mild Dry Cough.  No Known Acute Trauma or Injury.  NAD Noted during Triage. A&Ox4. GCS 15. Ambulatory.

## 2021-07-18 NOTE — Discharge Instructions (Addendum)
You were seen today for an exacerbation of your back pain and for upper respiratory symptoms. Your strep test was negative. As discussed, I have prescribed anti-inflammatory medicines and a steroid prescription. Your other symptoms are consistent with a viral infection. Please use over the counter medicines for symptom management. Follow up as needed.

## 2021-07-18 NOTE — ED Notes (Signed)
D/c paperwork reviewed with pt, including prescriptions. No questions or concerns at time of d/c. Pt ambulatory to ED exit on RA.

## 2021-08-22 ENCOUNTER — Emergency Department (HOSPITAL_BASED_OUTPATIENT_CLINIC_OR_DEPARTMENT_OTHER)
Admission: EM | Admit: 2021-08-22 | Discharge: 2021-08-22 | Disposition: A | Discharge status: Home / Self Care | Payer: BC Managed Care – PPO | Attending: Emergency Medicine | Admitting: Emergency Medicine

## 2021-08-22 ENCOUNTER — Encounter (HOSPITAL_BASED_OUTPATIENT_CLINIC_OR_DEPARTMENT_OTHER): Payer: Self-pay | Admitting: Emergency Medicine

## 2021-08-22 ENCOUNTER — Other Ambulatory Visit: Payer: Self-pay

## 2021-08-22 DIAGNOSIS — M25561 Pain in right knee: Secondary | ICD-10-CM | POA: Insufficient documentation

## 2021-08-22 DIAGNOSIS — Z79899 Other long term (current) drug therapy: Secondary | ICD-10-CM | POA: Diagnosis not present

## 2021-08-22 DIAGNOSIS — H9201 Otalgia, right ear: Secondary | ICD-10-CM | POA: Diagnosis not present

## 2021-08-22 DIAGNOSIS — I10 Essential (primary) hypertension: Secondary | ICD-10-CM | POA: Insufficient documentation

## 2021-08-22 DIAGNOSIS — H66001 Acute suppurative otitis media without spontaneous rupture of ear drum, right ear: Secondary | ICD-10-CM

## 2021-08-22 DIAGNOSIS — Z20822 Contact with and (suspected) exposure to covid-19: Secondary | ICD-10-CM | POA: Insufficient documentation

## 2021-08-22 LAB — RESP PANEL BY RT-PCR (FLU A&B, COVID) ARPGX2
Influenza A by PCR: NEGATIVE
Influenza B by PCR: NEGATIVE
SARS Coronavirus 2 by RT PCR: NEGATIVE

## 2021-08-22 MED ORDER — PREDNISONE 20 MG PO TABS
40.0000 mg | ORAL_TABLET | Freq: Once | ORAL | Status: AC
Start: 2021-08-22 — End: 2021-08-22
  Administered 2021-08-22: 40 mg via ORAL
  Filled 2021-08-22: qty 2

## 2021-08-22 MED ORDER — FLUCONAZOLE 150 MG PO TABS: 150.0000 mg | ORAL_TABLET | Freq: Every day | ORAL | 1 refills | Status: DC

## 2021-08-22 MED ORDER — AMOXICILLIN 500 MG PO CAPS
500.0000 mg | ORAL_CAPSULE | Freq: Once | ORAL | Status: AC
  Administered 2021-08-22: 500 mg via ORAL
  Filled 2021-08-22: qty 1

## 2021-08-22 MED ORDER — AMOXICILLIN 500 MG PO CAPS: 500.0000 mg | ORAL_CAPSULE | Freq: Three times a day (TID) | ORAL | 0 refills | Status: DC

## 2021-08-22 MED ORDER — PREDNISONE 10 MG PO TABS: 20.0000 mg | ORAL_TABLET | Freq: Two times a day (BID) | ORAL | 0 refills | Status: DC

## 2021-08-22 NOTE — ED Triage Notes (Signed)
R ear pain without drainage x1 day. No fever, chills, N/V, cough, congestion. Endorses sore throat.  R knee pain, ongoing for several months r/t a worker's comp case. Swelling has been much worse x 3 days. Ambulatory at this time but painful.

## 2021-08-22 NOTE — Discharge Instructions (Signed)
Begin taking amoxicillin and prednisone as prescribed.  Follow-up with primary doctor if not improving in the next week.

## 2021-08-22 NOTE — ED Provider Notes (Signed)
[facility] EMERGENCY DEPT Provider Note   CSN: [id number] Arrival date & time: 08/22/21  8546     History  No chief complaint on file.   [name] is a 51 y.o. female.  Patient is a 51 year old female with past medical history of hypertension, degenerative joint disease, migraines.  Patient presenting today for evaluation of right knee pain and right ear pain.  She describes ongoing pain in her knee for several months.  She was seen here 2 months ago with similar complaints.  She had x-rays and CT scan performed showing degenerative changes.  She has been seen by orthopedics, however no plans for surgery.  She returns today stating that her knee began hurting again 2 days ago in the absence of any specific injury or trauma.  Pain is worse with bearing weight and relieved with rest.  She also describes pain in her right ear with no hearing loss, drainage, or fevers.  The history is provided by the patient.       Home Medications Prior to Admission medications   Medication Sig Start Date End Date Taking? Authorizing Provider  celecoxib (CELEBREX) 200 MG capsule Take 1 capsule (200 mg total) by mouth 2 (two) times daily. 07/15/19   Wallis Bamberg, PA-C  cephALEXin (KEFLEX) 500 MG capsule Take 1 capsule (500 mg total) by mouth 2 (two) times daily. 07/13/19   Belinda Fisher, PA-C  Cholecalciferol 1.25 MG (50000 UT) TABS Take 1 weekly 04/11/19   Corinna Capra A, DO  fluconazole (DIFLUCAN) 150 MG tablet Take 1 tablet (150 mg total) by mouth daily. Take second dose 72 hours later if symptoms still persists. 07/13/19   Cathie Hoops, Amy V, PA-C  hydrochlorothiazide (HYDRODIURIL) 25 MG tablet Take 25 tablets by mouth daily. 03/25/16   [provider]  ibuprofen (ADVIL) 600 MG tablet Take 1 tablet (600 mg total) by mouth every 6 (six) hours as needed. 07/18/21   Barrie Dunker B, PA-C  lidocaine (LIDODERM) 5 % Place 1 patch onto the skin daily. Remove & Discard patch within 12 hours or as  directed by MD 04/23/21   Palumbo, April, MD  lidocaine (LIDODERM) 5 % Place 1 patch onto the skin daily. Remove & Discard patch within 12 hours or as directed by MD 04/23/21   Nicanor Alcon, April, MD  potassium chloride (KLOR-CON) 10 MEQ tablet Take 1 tablet (10 mEq total) by mouth daily. 04/11/19   Corinna Capra A, DO  predniSONE (STERAPRED UNI-PAK 21 TAB) 10 MG (21) TBPK tablet Take by mouth daily. Take 6 tabs by mouth daily  for 2 days, then 5 tabs for 2 days, then 4 tabs for 2 days, then 3 tabs for 2 days, 2 tabs for 2 days, then 1 tab by mouth daily for 2 days 07/18/21   Lorre Nick, MD  tiZANidine (ZANAFLEX) 2 MG tablet Take 1 tablet (2 mg total) by mouth every 8 (eight) hours as needed for muscle spasms. 07/13/19   Cathie Hoops, Amy V, PA-C  triamcinolone cream (KENALOG) 0.1 % Apply 1 application topically daily. 05/06/16   [provider]  buPROPion (WELLBUTRIN SR) 150 MG 12 hr tablet Take 1 tablet (150 mg total) by mouth daily. 04/11/19 07/13/19  Corinna Capra A, DO      Allergies    Escitalopram, Escitalopram oxalate, and Topiramate    Review of Systems   Review of Systems  All other systems reviewed and are negative.   Physical Exam Updated Vital Signs BP 133/77 (BP Location:  Right Arm)   Pulse 75   Temp 97.7 F (36.5 C) (Temporal)   Resp 16   SpO2 97%  Physical Exam Vitals and nursing note reviewed.  Constitutional:      General: She is not in acute distress.    Appearance: Normal appearance. She is not ill-appearing.  HENT:     Head: Normocephalic and atraumatic.     Left Ear: Tympanic membrane normal.     Ears:     Comments: The right TM is erythematous. Pulmonary:     Effort: Pulmonary effort is normal.  Musculoskeletal:     Comments: The right knee appears grossly normal.  Exam is somewhat limited secondary to body habitus, but I palpate no definite effusion.  She has good range of motion with no crepitus.  There is no varus or valgus laxity.  Neurological:     Mental Status:  She is alert and oriented to person, place, and time.     ED Results / Procedures / Treatments   Labs (all labs ordered are listed, but only abnormal results are displayed) Labs Reviewed  RESP PANEL BY RT-PCR (FLU A&B, COVID) [id number]    EKG None  Radiology No results found.  Procedures Procedures    Medications Ordered in ED Medications  predniSONE (DELTASONE) tablet 40 mg (has no administration in time range)  amoxicillin (AMOXIL) capsule 500 mg (has no administration in time range)    ED Course/ Medical Decision Making/ A&P  Patient with ongoing knee pain for several months, worse the past few days.  She has history of degenerative changes in the knee and will be treated with prednisone and rest.  I do not feel as though imaging studies are required given that they were performed 2 months ago and patient has experienced no new trauma.  She also has what appears to be a right otitis media.  She will receive amoxicillin for this.  Patient to follow-up as needed.  Final Clinical Impression(s) / ED Diagnoses Final diagnoses:  None    Rx / DC Orders ED Discharge Orders     None         Geoffery Lyons, MD 08/22/21 [phone]

## 2021-08-26 ENCOUNTER — Encounter (INDEPENDENT_AMBULATORY_CARE_PROVIDER_SITE_OTHER): Payer: Self-pay

## 2021-11-01 ENCOUNTER — Emergency Department (HOSPITAL_BASED_OUTPATIENT_CLINIC_OR_DEPARTMENT_OTHER): Payer: BC Managed Care – PPO | Admitting: Radiology

## 2021-11-01 ENCOUNTER — Emergency Department (HOSPITAL_BASED_OUTPATIENT_CLINIC_OR_DEPARTMENT_OTHER)
Admission: EM | Admit: 2021-11-01 | Discharge: 2021-11-01 | Disposition: A | Discharge status: Home / Self Care | Payer: BC Managed Care – PPO | Attending: Emergency Medicine | Admitting: Emergency Medicine

## 2021-11-01 ENCOUNTER — Other Ambulatory Visit: Payer: Self-pay

## 2021-11-01 ENCOUNTER — Encounter (HOSPITAL_BASED_OUTPATIENT_CLINIC_OR_DEPARTMENT_OTHER): Payer: Self-pay | Admitting: *Deleted

## 2021-11-01 DIAGNOSIS — X58XXXA Exposure to other specified factors, initial encounter: Secondary | ICD-10-CM | POA: Diagnosis not present

## 2021-11-01 DIAGNOSIS — E876 Hypokalemia: Secondary | ICD-10-CM | POA: Insufficient documentation

## 2021-11-01 DIAGNOSIS — H6121 Impacted cerumen, right ear: Secondary | ICD-10-CM | POA: Diagnosis not present

## 2021-11-01 DIAGNOSIS — S83011A Lateral subluxation of right patella, initial encounter: Secondary | ICD-10-CM | POA: Insufficient documentation

## 2021-11-01 DIAGNOSIS — G8929 Other chronic pain: Secondary | ICD-10-CM

## 2021-11-01 DIAGNOSIS — H9201 Otalgia, right ear: Secondary | ICD-10-CM

## 2021-11-01 DIAGNOSIS — I10 Essential (primary) hypertension: Secondary | ICD-10-CM | POA: Insufficient documentation

## 2021-11-01 DIAGNOSIS — M25561 Pain in right knee: Secondary | ICD-10-CM | POA: Diagnosis present

## 2021-11-01 DIAGNOSIS — Z79899 Other long term (current) drug therapy: Secondary | ICD-10-CM | POA: Diagnosis not present

## 2021-11-01 LAB — CBC WITH DIFFERENTIAL/PLATELET
Abs Immature Granulocytes: 0.04 10*3/uL (ref 0.00–0.07)
Basophils Absolute: 0 10*3/uL (ref 0.0–0.1)
Basophils Relative: 0 %
Eosinophils Absolute: 0.2 10*3/uL (ref 0.0–0.5)
Eosinophils Relative: 3 %
HCT: 36.9 % (ref 36.0–46.0)
Hemoglobin: 12.5 g/dL (ref 12.0–15.0)
Immature Granulocytes: 0 %
Lymphocytes Relative: 23 %
Lymphs Abs: 2.1 10*3/uL (ref 0.7–4.0)
MCH: 27.8 pg (ref 26.0–34.0)
MCHC: 33.9 g/dL (ref 30.0–36.0)
MCV: 82 fL (ref 80.0–100.0)
Monocytes Absolute: 0.5 10*3/uL (ref 0.1–1.0)
Monocytes Relative: 5 %
Neutro Abs: 6.2 10*3/uL (ref 1.7–7.7)
Neutrophils Relative %: 69 %
Platelets: 171 10*3/uL (ref 150–400)
RBC: 4.5 MIL/uL (ref 3.87–5.11)
RDW: 12.5 % (ref 11.5–15.5)
WBC: 9 10*3/uL (ref 4.0–10.5)
nRBC: 0 % (ref 0.0–0.2)

## 2021-11-01 LAB — BASIC METABOLIC PANEL
Anion gap: 9 (ref 5–15)
BUN: 13 mg/dL (ref 6–20)
CO2: 32 mmol/L (ref 22–32)
Calcium: 9.2 mg/dL (ref 8.9–10.3)
Chloride: 98 mmol/L (ref 98–111)
Creatinine, Ser: 0.72 mg/dL (ref 0.44–1.00)
GFR, Estimated: >60 mL/min (ref >60)
Glucose, Bld: 116 mg/dL — ABNORMAL HIGH (ref 70–99)
Potassium: 2.9 mmol/L — ABNORMAL LOW (ref 3.5–5.1)
Sodium: 139 mmol/L (ref 135–145)

## 2021-11-01 LAB — MAGNESIUM: Magnesium: 2.1 mg/dL (ref 1.7–2.4)

## 2021-11-01 LAB — SEDIMENTATION RATE: Sed Rate: 33 mm/hr — ABNORMAL HIGH (ref 0–22)

## 2021-11-01 LAB — C-REACTIVE PROTEIN: CRP: 4.5 mg/dL — ABNORMAL HIGH (ref <1.0)

## 2021-11-01 MED ORDER — POTASSIUM CHLORIDE CRYS ER 20 MEQ PO TBCR
40.0000 meq | EXTENDED_RELEASE_TABLET | Freq: Once | ORAL | Status: AC
  Administered 2021-11-01: 40 meq via ORAL
  Filled 2021-11-01: qty 2

## 2021-11-01 MED ORDER — NAPROXEN 500 MG PO TABS: 500.0000 mg | ORAL_TABLET | Freq: Two times a day (BID) | ORAL | 0 refills | Status: DC

## 2021-11-01 MED ORDER — LIDOCAINE-EPINEPHRINE 2 %-1:100000 IJ SOLN: 20.0000 mL | Freq: Once | INTRAMUSCULAR | Status: DC

## 2021-11-01 MED ORDER — KETOROLAC TROMETHAMINE 60 MG/2ML IM SOLN
60.0000 mg | Freq: Once | INTRAMUSCULAR | Status: AC
  Administered 2021-11-01: 60 mg via INTRAMUSCULAR
  Filled 2021-11-01: qty 2

## 2021-11-01 MED ORDER — POTASSIUM CHLORIDE CRYS ER 20 MEQ PO TBCR: 20.0000 meq | EXTENDED_RELEASE_TABLET | Freq: Every day | ORAL | 0 refills | Status: DC

## 2021-11-01 NOTE — ED Provider Notes (Signed)
[facility] EMERGENCY DEPT Provider Note   CSN: [id number] Arrival date & time: 11/01/21  0255     History  Chief Complaint  Patient presents with   Knee Pain    [name] is a 51 y.o. female.   Knee Pain Associated symptoms: back pain   Associated symptoms: no fever      51 year old female with medical history significant for HTN, degenerative disc disease of the lumbar spine, chronic low back pain, morbid obesity, chronic joint pain, depression, anxiety who presents to the emergency department with acute on chronic right knee pain.  The patient states that she has been seen multiple times for knee pain in the right knee.  She has previously had CT imaging that revealed chronic degenerative changes in the right knee.  She also has evidence of subluxation of the patella laterally on CT imaging.  She states that she has been working with an Nature conservation officer. issues and has not yet followed up with an outpatient orthopedist to discuss her chronic knee pain.  She has seen [facility] regarding her low back pain.   She states that on Friday, her pain flared up acutely and has not been managed by medications outpatient.  She denies any fevers or chills.  She denies any redness of the knee joint.  She is able to range the knee joint on her own.  Regarding her chronic back pain, she denies any red flag symptoms.  She denies any recent falls or trauma, she denies any numbness or weakness in her lower extremities, she denies any urinary or fecal incontinence, denies any saddle anesthesia. She is working outpatient for a referral to a pain specialist. She had an MRI in May 2022 which demonstrated "facet arthropathy with bilateral facet joint effusions at L2-3, L3-4 and L4-5. There is no disc herniation or specific cause for a radiculopathy identified. "  CT of the right knee in April 2023 revealed: "IMPRESSION: 1. Lateral patellar subluxation with the  interfacetal ridge sitting on top of the anterior aspect of the lateral femoral condyle with evidence of chronic cartilage loss and subcortical cystic changes along the lateral patellar facet. 2. Likely disrupted or least severely thinned medial patellofemoral ligament. Remainder of the MCL complex also appears thinned out. There is laxity of the medial patellar retinaculum. 3. On coronal reformatting, the menisci appear at least slightly outward extruded but are otherwise not well evaluated. 4. Moderate suprapatellar bursal fluid greater laterally, with mild femorotibial DJD. 5. Patellar enthesopathy. 6. Partial fatty atrophy in the medial head of the gastrocnemius. 7. Popliteal artery calcifications."  On chart review, the patient has been seen by outpatient orthopedics, Dr. Nelva Bush of [facility].  Per notes from 10/03/21:  1. Lumbar spine injury (work-related) - February 2022  - Surgery not recommended by two orthopedic surgeons  - Patient not ready for injection therapy (S1 selective nerve root block); risks include infection, bleeding, nerve injury (rare)  - Patient at maximum medical improvement with a 1% permanent partial impairment rating of the lumbar spine  - Treatment: Continue ibuprofen 600 mg, 1 tablet three times a day as needed for pain; obtain from primary care doctor in the future  - Follow up as needed   2. 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 Pain management  -  Patient reports average pain level of 8.5  - Encourage over-the-counter medications (e.g., Tylenol, Aleve) with caution due to potential side effects  - Patient may need to manage and live with pain  - Reevaluate injection therapy if patient's condition changes or if she decides to pursue  this option in the future   Home Medications Prior to Admission medications   Medication Sig Start Date End Date Taking? Authorizing Provider  buPROPion (WELLBUTRIN) 100 MG tablet Take 100 mg by mouth 2 (two) times daily.   Yes [provider]  FLUoxetine (PROZAC) 20 MG capsule Take 20 mg by mouth daily.   Yes [provider]  naproxen (NAPROSYN) 500 MG tablet Take 1 tablet (500 mg total) by mouth 2 (two) times daily. 11/01/21  Yes Regan Lemming, MD  potassium chloride SA (KLOR-CON M) 20 MEQ tablet Take 1 tablet (20 mEq total) by mouth daily for 5 days. 11/01/21 11/06/21 Yes Regan Lemming, MD  Cholecalciferol 1.25 MG (50000 UT) TABS Take 1 weekly 04/11/19   Jearld Lesch A, DO  hydrochlorothiazide (HYDRODIURIL) 25 MG tablet Take 25 tablets by mouth daily. 03/25/16   [provider]  ibuprofen (ADVIL) 600 MG tablet Take 1 tablet (600 mg total) by mouth every 6 (six) hours as needed. 07/18/21   Cherlynn June B, PA-C  lidocaine (LIDODERM) 5 % Place 1 patch onto the skin daily. Remove & Discard patch within 12 hours or as directed by MD 04/23/21   Palumbo, April, MD  lidocaine (LIDODERM) 5 % Place 1 patch onto the skin daily. Remove & Discard patch within 12 hours or as directed by MD 04/23/21   Randal Buba, April, MD  triamcinolone cream (KENALOG) 0.1 % Apply 1 application topically daily. 05/06/16   [provider]      Allergies    Escitalopram, Escitalopram oxalate, and Topiramate    Review of Systems   Review of Systems  Constitutional:  Negative for chills and fever.  Musculoskeletal:  Positive for arthralgias and back pain.  All other systems reviewed and are negative.   Physical Exam Updated Vital Signs BP (!) 129/103   Pulse 70   Temp 98.7 F (37.1 C) (Oral)   Resp 18   Ht $R'5\' 9"'vG$  (1.753 m)   Wt (!) 158.8 kg   LMP 10/25/2021 (Approximate)   SpO2 94%   BMI 51.69 kg/m  Physical Exam Vitals and nursing note reviewed.  Constitutional:      General:  She is not in acute distress.    Appearance: She is obese. She is not ill-appearing.  HENT:     Head: Normocephalic and atraumatic.  Eyes:     Conjunctiva/sclera: Conjunctivae normal.     Pupils: Pupils are equal, round, and reactive to light.  Cardiovascular:     Rate and Rhythm: Normal rate and regular rhythm.  Pulmonary:     Effort: Pulmonary effort is normal. No respiratory distress.  Abdominal:     General: There is no distension.     Tenderness: There is no guarding.  Musculoskeletal:        General: No deformity or signs of injury.     Cervical back: Neck supple.     Comments: Mild lateral subluxation of the patella. No palpable joint effusion. No redness or warmth, intact ROM of the right knee actively and passively, No pain with ROM.  Skin:    Findings: No lesion or rash.  Neurological:     General: No focal deficit present.  Mental Status: She is alert. Mental status is at baseline.    ED Results / Procedures / Treatments   Labs (all labs ordered are listed, but only abnormal results are displayed) Labs Reviewed  BASIC METABOLIC PANEL - Abnormal; Notable for the following components:      Result Value   Potassium 2.9 (*)    Glucose, Bld 116 (*)    All other components within normal limits  SEDIMENTATION RATE - Abnormal; Notable for the following components:   Sed Rate 33 (*)    All other components within normal limits  CBC WITH DIFFERENTIAL/PLATELET  MAGNESIUM  C-REACTIVE PROTEIN    EKG None  Radiology DG Knee Complete 4 Views Right  Result Date: 11/01/2021 CLINICAL DATA:  51 year old female with knee pain. EXAM: RIGHT KNEE - COMPLETE 4+ VIEW COMPARISON:  Right knee series 04/23/2021. FINDINGS: Bone mineralization is within normal limits. Small joint effusion is difficult to exclude. Suspicion of mild lateral patellar subluxation again noted. Otherwise stable joint spaces and alignment, relatively maintained with mild tricompartmental degenerative  spurring. Intact patella. No acute osseous abnormality identified. Calcified popliteal artery atherosclerosis. IMPRESSION: 1. Difficult to exclude small joint effusion. Stable possible mild lateral subluxation of the patella and mild tricompartmental degenerative spurring. 2. No acute osseous abnormality identified. Electronically Signed   By: Genevie Ann M.D.   On: 11/01/2021 05:10    Procedures .Ortho Injury Treatment  Date/Time: 11/01/2021 8:50 AM  Performed by: Regan Lemming, MD Authorized by: Regan Lemming, MD   Consent:    Consent obtained:  Verbal   Consent given by:  Patient   Alternatives discussed:  No treatmentInjury location: knee Location details: right knee Injury type: dislocation Dislocation type: lateral patellar Pre-procedure neurovascular assessment: neurovascularly intact  Anesthesia: Local anesthesia used: no  Patient sedated: NoImmobilization: brace Splint Applied by: ED Nurse Post-procedure neurovascular assessment: post-procedure neurovascularly intact       Medications Ordered in ED Medications  ketorolac (TORADOL) injection 60 mg (60 mg Intramuscular Given 11/01/21 0743)  potassium chloride SA (KLOR-CON M) CR tablet 40 mEq (40 mEq Oral Given 11/01/21 6294)    ED Course/ Medical Decision Making/ A&P                           Medical Decision Making Amount and/or Complexity of Data Reviewed Labs: ordered. Radiology: ordered.  Risk Prescription drug management.    51 year old female with medical history significant for HTN, degenerative disc disease of the lumbar spine, chronic low back pain, morbid obesity, chronic joint pain, depression, anxiety who presents to the emergency department with acute on chronic right knee pain.  The patient states that she has been seen multiple times for knee pain in the right knee.  She has previously had CT imaging that revealed chronic degenerative changes in the right knee.  She also has evidence of subluxation  of the patella laterally on CT imaging.  She states that she has been working with an Nature conservation officer. issues and has not yet followed up with an outpatient orthopedist to discuss her chronic knee pain. She has seen [facility] regarding her low back pain.   She states that on Friday, her pain flared up acutely and has not been managed by medications outpatient.  She denies any fevers or chills.  She denies any redness of the knee joint.  She is able to range the knee joint on her own.  Regarding her chronic back pain, she denies  any red flag symptoms.  She denies any recent falls or trauma, she denies any numbness or weakness in her lower extremities, she denies any urinary or fecal incontinence, denies any saddle anesthesia. She is working outpatient for a referral to a pain specialist.  On arrival, the patient was vitally stable.  Physical exam significant for mild lateral subluxation of the right patella.  The patella was put back in place with gentle pressure laterally to medially with subsequent improvement in patient discomfort.  The patient has no erythema or warmth of the knee to suggest septic arthritis or gout flare, and has intact passive and active range of motion of the joint. Neurovascularly intact. Given the lateral displacement of the patella concerning for subluxation, the patient was placed in a knee immobilizer and advised to follow-up with an orthopedist.  She is chronically on NSAIDs for degeneration of the low back in addition to chronic back pain.  Will obtain screening labs and further evaluate.  Her evaluation significant for CBC without a leukocytosis or anemia, BMP with hypokalemia to 2.9, magnesium normal at 2.1, normal renal function.  An ESR was nonspecifically elevated to 33.  Regarding the patient's chronic back pain, she has no red flag symptoms today and is currently not in discomfort. The patient was administered IM Toradol in addition to potassium  supplementation.  Following Toradol administration, the patient's symptoms significantly improved.  She has good range of motion of the knee.  I have much lower suspicion for gout or septic arthritis at this time.  Considered arthrocentesis of the knee as her x-ray imaging had read "cannot exclude small effusion" however in the setting of patellar subluxation, feel that this is likely the etiology of her acute flare in addition to known acute on chronic osteoarthritis.  I provided return precautions regarding the development of septic arthritis or gout flare and the patient endorsed understanding.  Referral for follow-up outpatient with orthopedics placed. Referral to pain management placed. The patient was provided with a walker to assist with ambulation.   DC Instructions: Weight bear as tolerated to the right lower extremity. Perform strengthening exercises as tolerating to strengthen your knee. Your patella was mildly dislocated on exam today but I was able to put it back in place. This likely was causing you discomfort. Recommend NSAIDs for pain control (your kidney function was normal today) and utilize the knee immobilizer while ambulating until you can follow-up with an orthopedist. Watch for signs of a knee infection which include worsening swelling, redness and warmth, and worsening pain with range of motion of the knee. We were able to remove a small amount of earwax from your right ear which might have been causing some discomfort. There is no evidence of an ear infection on exam. Finally, your potassium was mildly low today. I will put you on a short course of oral potassium supplement.   Final Clinical Impression(s) / ED Diagnoses Final diagnoses:  Earache on right  Excessive cerumen in ear canal, right  Chronic pain of right knee  Lateral subluxation of right patella, initial encounter  Hypokalemia    Rx / DC Orders ED Discharge Orders          Ordered    potassium chloride SA  (KLOR-CON M) 20 MEQ tablet  Daily        11/01/21 1024    naproxen (NAPROSYN) 500 MG tablet  2 times daily        11/01/21 1024    AMB referral to  orthopedics        11/01/21 1030    Ambulatory referral to Pain Clinic        11/01/21 1033              Regan Lemming, MD 11/01/21 1035

## 2021-11-01 NOTE — Discharge Instructions (Addendum)
Weight bear as tolerated to the right lower extremity. Perform strengthening exercises as tolerating to strengthen your knee. Your patella was mildly dislocated on exam today but I was able to put it back in place. This likely was causing you discomfort. Recommend NSAIDs for pain control (your kidney function was normal today) and utilize the knee immobilizer while ambulating until you can follow-up with an orthopedist. Watch for signs of a knee infection which include worsening swelling, redness and warmth, and worsening pain with range of motion of the knee. We were able to remove a small amount of earwax from your right ear which might have been causing some discomfort. There is no evidence of an ear infection on exam. Finally, your potassium was mildly low today. I will put you on a short course of oral potassium supplement.

## 2021-11-01 NOTE — ED Triage Notes (Addendum)
C/o right knee pain since Friday night/Sat morning. States she hurt her knee in Feb 2022 and has had ongoing problems since then. C/o right side pain. Denies any new injury. States she took ibuprofen yesterday 2100. C/o right ear pain as well.

## 2021-11-20 ENCOUNTER — Encounter (HOSPITAL_BASED_OUTPATIENT_CLINIC_OR_DEPARTMENT_OTHER): Payer: Self-pay

## 2021-11-20 ENCOUNTER — Emergency Department (HOSPITAL_BASED_OUTPATIENT_CLINIC_OR_DEPARTMENT_OTHER)
Admission: EM | Admit: 2021-11-20 | Discharge: 2021-11-20 | Disposition: A | Discharge status: Home / Self Care | Payer: BC Managed Care – PPO | Attending: Emergency Medicine | Admitting: Emergency Medicine

## 2021-11-20 DIAGNOSIS — M545 Low back pain, unspecified: Secondary | ICD-10-CM | POA: Diagnosis not present

## 2021-11-20 DIAGNOSIS — T502X5A Adverse effect of carbonic-anhydrase inhibitors, benzothiadiazides and other diuretics, initial encounter: Secondary | ICD-10-CM | POA: Insufficient documentation

## 2021-11-20 DIAGNOSIS — Z79899 Other long term (current) drug therapy: Secondary | ICD-10-CM | POA: Diagnosis not present

## 2021-11-20 DIAGNOSIS — G8929 Other chronic pain: Secondary | ICD-10-CM

## 2021-11-20 DIAGNOSIS — M25461 Effusion, right knee: Secondary | ICD-10-CM | POA: Diagnosis not present

## 2021-11-20 DIAGNOSIS — M25561 Pain in right knee: Secondary | ICD-10-CM | POA: Diagnosis not present

## 2021-11-20 DIAGNOSIS — M549 Dorsalgia, unspecified: Secondary | ICD-10-CM | POA: Diagnosis present

## 2021-11-20 DIAGNOSIS — E876 Hypokalemia: Secondary | ICD-10-CM

## 2021-11-20 DIAGNOSIS — I10 Essential (primary) hypertension: Secondary | ICD-10-CM | POA: Insufficient documentation

## 2021-11-20 LAB — POTASSIUM: Potassium: 3.1 mmol/L — ABNORMAL LOW (ref 3.5–5.1)

## 2021-11-20 MED ORDER — POTASSIUM CHLORIDE CRYS ER 20 MEQ PO TBCR: EXTENDED_RELEASE_TABLET | ORAL | 0 refills | Status: AC

## 2021-11-20 MED ORDER — KETOROLAC TROMETHAMINE 30 MG/ML IJ SOLN
60.0000 mg | Freq: Once | INTRAMUSCULAR | Status: AC
  Administered 2021-11-20: 60 mg via INTRAMUSCULAR
  Filled 2021-11-20: qty 2

## 2021-11-20 MED ORDER — CYCLOBENZAPRINE HCL 10 MG PO TABS
10.0000 mg | ORAL_TABLET | Freq: Once | ORAL | Status: DC
  Filled 2021-11-20: qty 1

## 2021-11-20 MED ORDER — CYCLOBENZAPRINE HCL 10 MG PO TABS: 10.0000 mg | ORAL_TABLET | Freq: Three times a day (TID) | ORAL | 0 refills | Status: AC | PRN

## 2021-11-20 MED ORDER — POTASSIUM CHLORIDE CRYS ER 20 MEQ PO TBCR
40.0000 meq | EXTENDED_RELEASE_TABLET | Freq: Once | ORAL | Status: AC
  Administered 2021-11-20: 40 meq via ORAL
  Filled 2021-11-20: qty 2

## 2021-11-20 MED ORDER — NAPROXEN 500 MG PO TABS: 500.0000 mg | ORAL_TABLET | Freq: Two times a day (BID) | ORAL | 0 refills | Status: AC

## 2021-11-20 NOTE — ED Provider Notes (Signed)
[facility] EMERGENCY DEPT Provider Note   CSN: [id number] Arrival date & time: 11/20/21  0024     History  Chief Complaint  Patient presents with   Back Pain    [name] is a 51 y.o. female.  The history is provided by the patient.  Back Pain She has history of hypertension, anxiety, degenerative disc disease, degenerative joint disease and comes in complaining of worsening of her back pain over the last week.  She denies any unusual activity and denies any trauma.  She had a back injury at work in February and is currently working to get Workmen's mentation authorization.  She has been seen in the emergency department several times for pain related to this injury.  She has also noted some pain and swelling in her right knee, also without any recent trauma.  She has had problems with pain in that knee before.  She has taken ibuprofen and methocarbamol without relief.  She does state that her right leg is giving out on her on a couple of occasions.  She denies any numbness or tingling and denies any bowel or bladder dysfunction.   Home Medications Prior to Admission medications   Medication Sig Start Date End Date Taking? Authorizing Provider  buPROPion (WELLBUTRIN) 100 MG tablet Take 100 mg by mouth 2 (two) times daily.    [provider]  Cholecalciferol 1.25 MG (50000 UT) TABS Take 1 weekly 04/11/19   Corinna Capra A, DO  FLUoxetine (PROZAC) 20 MG capsule Take 20 mg by mouth daily.    [provider]  hydrochlorothiazide (HYDRODIURIL) 25 MG tablet Take 25 tablets by mouth daily. 03/25/16   [provider]  ibuprofen (ADVIL) 600 MG tablet Take 1 tablet (600 mg total) by mouth every 6 (six) hours as needed. 07/18/21   Barrie Dunker B, PA-C  lidocaine (LIDODERM) 5 % Place 1 patch onto the skin daily. Remove & Discard patch within 12 hours or as directed by MD 04/23/21   Palumbo, April, MD  lidocaine (LIDODERM) 5 % Place 1 patch onto the skin  daily. Remove & Discard patch within 12 hours or as directed by MD 04/23/21   Nicanor Alcon, April, MD  naproxen (NAPROSYN) 500 MG tablet Take 1 tablet (500 mg total) by mouth 2 (two) times daily. 11/01/21   Ernie Avena, MD  potassium chloride SA (KLOR-CON M) 20 MEQ tablet Take 1 tablet (20 mEq total) by mouth daily for 5 days. 11/01/21 11/06/21  Ernie Avena, MD  triamcinolone cream (KENALOG) 0.1 % Apply 1 application topically daily. 05/06/16   [provider]      Allergies    Escitalopram, Escitalopram oxalate, and Topiramate    Review of Systems   Review of Systems  Musculoskeletal:  Positive for back pain.  All other systems reviewed and are negative.   Physical Exam Updated Vital Signs BP 125/68 (BP Location: Right Arm)   Pulse 71   Temp 98.1 F (36.7 C) (Oral)   Resp 20   Ht 5\' 9"  (1.753 m)   Wt (!) 154.2 kg   LMP 10/25/2021 (Approximate)   SpO2 97%   BMI 50.21 kg/m  Physical Exam Vitals and nursing note reviewed.   51 year old female, resting comfortably and in no acute distress. Vital signs are normal. Oxygen saturation is 97%, which is normal. Head is normocephalic and atraumatic. PERRLA, EOMI. Oropharynx is clear. Neck is nontender and supple without adenopathy or JVD. Back is mildly tender in the mid  and lower lumbar area with moderate right paralumbar spasm.  Straight leg raise is negative on the left, positive on the right at 30 degrees. Lungs are clear without rales, wheezes, or rhonchi. Chest is nontender. Heart has regular rate and rhythm without murmur. Abdomen is soft, flat, nontender without masses or hepatosplenomegaly and peristalsis is normoactive. Extremities: No obvious swelling noted of the right knee, no effusion found.  There is no instability on valgus or varus stress.  Lachman and McMurray's tests are grossly negative, but pain is elicited with attempted to perform McMurray's test. Skin is warm and dry without rash. Neurologic: Mental  status is normal, cranial nerves are intact, moves all extremities equally, no sensory deficits identified.  ED Results / Procedures / Treatments   Labs (all labs ordered are listed, but only abnormal results are displayed) Labs Reviewed  POTASSIUM - Abnormal; Notable for the following components:      Result Value   Potassium 3.1 (*)    All other components within normal limits   Procedures Procedures    Medications Ordered in ED Medications  cyclobenzaprine (FLEXERIL) tablet 10 mg (10 mg Oral Patient Refused/Not Given 11/20/21 0150)  ketorolac (TORADOL) 30 MG/ML injection 60 mg (60 mg Intramuscular Given 11/20/21 0144)  potassium chloride SA (KLOR-CON M) CR tablet 40 mEq (40 mEq Oral Given 11/20/21 0232)    ED Course/ Medical Decision Making/ A&P                           Medical Decision Making  Exacerbation of chronic back pain, exacerbation of right knee arthritis.  Old records reviewed, and she has ED visits on 08/22/2021, 07/18/2021, 05/07/2021 for back pain.  Also an ED visit on 04/23/2021 for right knee pain.  Right knee x-ray on 11/01/2021 showed degenerative changes.  MRI of the lumbar spine on 11/23/2018 shows multilevel facet hypertrophy.  Also, at ED visit on 11/01/2021, she was found to have potassium of 2.9 and given a 5-day course of potassium.  It is noted that she does take hydrochlorothiazide, so I am concerned that she had developed recurrence of her hypokalemia since she is still on a diuretic.  I have ordered an injection of ketorolac, a dose of oral cyclobenzaprine, and I have ordered a blood potassium level.  Potassium has come back low at 3.1.  I have ordered a dose of oral potassium.  She had good pain relief with above-noted treatment.  I am discharging her with prescriptions for naproxen and cyclobenzaprine as well as ongoing potassium supplementation.  She will need to discuss with her primary care provider whether to stay on a potassium supplement or whether to switch  to a combination of hydrochlorothiazide with a potassium sparing agent.  Final Clinical Impression(s) / ED Diagnoses Final diagnoses:  Acute exacerbation of chronic low back pain  Pain and swelling of right knee  Elevated blood pressure reading with diagnosis of hypertension  Diuretic-induced hypokalemia    Rx / DC Orders ED Discharge Orders          Ordered    cyclobenzaprine (FLEXERIL) 10 MG tablet  3 times daily PRN        11/20/21 0244    potassium chloride SA (KLOR-CON M) 20 MEQ tablet        11/20/21 0244    naproxen (NAPROSYN) 500 MG tablet  2 times daily        11/20/21 0244  Dione Booze, MD 11/20/21 [id number]

## 2021-11-20 NOTE — ED Triage Notes (Signed)
Pt. Ambulatory, [facility], CC low R back pain that has been persistent for months after an accident at work, states she comes in today due to increasing pain. States she has also had more swelling to her R knee.

## 2021-11-20 NOTE — Discharge Instructions (Signed)
Apply ice to areas that are painful.  Ice to be applied for 30 minutes at a time, 4 times a day.  If you are having pain in spite of taking naproxen and cyclobenzaprine, you may have acetaminophen.  Please discuss your low potassium level with your primary care provider.  You will either need to potassium supplements on a long-term basis, or will need to change your blood pressure medicines when that does not cause her potassium to get too low.

## 2022-08-05 ENCOUNTER — Telehealth: Payer: BC Managed Care – PPO | Admitting: Nurse Practitioner

## 2022-08-05 DIAGNOSIS — B372 Candidiasis of skin and nail: Secondary | ICD-10-CM

## 2022-08-06 MED ORDER — NYSTATIN 100000 UNIT/GM EX CREA: 1.0000 | TOPICAL_CREAM | Freq: Two times a day (BID) | CUTANEOUS | 0 refills | Status: AC

## 2022-08-06 NOTE — Progress Notes (Signed)
E Visit for Rash  We are sorry that you are not feeling well. Here is how we plan to help!   Based upon your presentation it appears you have a fungal infection.  I have prescribed: and Nystatin cream apply to the affected area twice daily   HOME CARE:  Take cool showers and avoid direct sunlight. Apply cool compress or wet dressings. Take a bath in an oatmeal bath.  Sprinkle content of one Aveeno packet under running faucet with comfortably warm water.  Bathe for 15-20 minutes, 1-2 times daily.  Pat dry with a towel. Do not rub the rash. Use hydrocortisone cream. Take an antihistamine like Benadryl for widespread rashes that itch.  The adult dose of Benadryl is 25-50 mg by mouth 4 times daily. Caution:  This type of medication may cause sleepiness.  Do not drink alcohol, drive, or operate dangerous machinery while taking antihistamines.  Do not take these medications if you have prostate enlargement.  Read package instructions thoroughly on all medications that you take.  GET HELP RIGHT AWAY IF:  Symptoms don't go away after treatment. Severe itching that persists. If you rash spreads or swells. If you rash begins to smell. If it blisters and opens or develops a yellow-brown crust. You develop a fever. You have a sore throat. You become short of breath.  MAKE SURE YOU:  Understand these instructions. Will watch your condition. Will get help right away if you are not doing well or get worse.  Thank you for choosing an e-visit.  Your e-visit answers were reviewed by a board certified advanced clinical practitioner to complete your personal care plan. Depending upon the condition, your plan could have included both over the counter or prescription medications.  Please review your pharmacy choice. Make sure the pharmacy is open so you can pick up prescription now. If there is a problem, you may contact your provider through [employer] and have the prescription routed to  another pharmacy.  Your safety is important to [place]. If you have drug allergies check your prescription carefully.   For the next 24 hours you can use MyChart to ask questions about today's visit, request a non-urgent call back, or ask for a work or school excuse. You will get an email in the next two days asking about your experience. I hope that your e-visit has been valuable and will speed your recovery.   Meds ordered this encounter  Medications   nystatin cream (MYCOSTATIN)    Sig: Apply 1 Application topically 2 (two) times daily.    Dispense:  30 g    Refill:  0     I spent approximately 5 minutes reviewing the patient's history, current symptoms and coordinating their care today.

## 2023-04-07 ENCOUNTER — Telehealth (HOSPITAL_BASED_OUTPATIENT_CLINIC_OR_DEPARTMENT_OTHER): Payer: Self-pay | Admitting: Certified Nurse Midwife

## 2023-04-07 NOTE — Telephone Encounter (Signed)
 Reviewed.[name]

## 2023-04-08 ENCOUNTER — Encounter (HOSPITAL_BASED_OUTPATIENT_CLINIC_OR_DEPARTMENT_OTHER): Payer: Self-pay | Admitting: Certified Nurse Midwife

## 2023-04-08 ENCOUNTER — Ambulatory Visit (HOSPITAL_BASED_OUTPATIENT_CLINIC_OR_DEPARTMENT_OTHER)

## 2023-04-08 ENCOUNTER — Other Ambulatory Visit (HOSPITAL_COMMUNITY)
Admission: RE | Admit: 2023-04-08 | Discharge: 2023-04-08 | Disposition: A | Discharge status: Home / Self Care | Source: Ambulatory Visit | Attending: Certified Nurse Midwife | Admitting: Certified Nurse Midwife

## 2023-04-08 ENCOUNTER — Ambulatory Visit (INDEPENDENT_AMBULATORY_CARE_PROVIDER_SITE_OTHER): Admitting: Certified Nurse Midwife

## 2023-04-08 VITALS — BP 152/78 | HR 86 | Ht 69.0 in | Wt 376.0 lb

## 2023-04-08 DIAGNOSIS — R102 Pelvic and perineal pain: Secondary | ICD-10-CM

## 2023-04-08 DIAGNOSIS — Z113 Encounter for screening for infections with a predominantly sexual mode of transmission: Secondary | ICD-10-CM | POA: Insufficient documentation

## 2023-04-08 DIAGNOSIS — Z124 Encounter for screening for malignant neoplasm of cervix: Secondary | ICD-10-CM

## 2023-04-08 DIAGNOSIS — N950 Postmenopausal bleeding: Secondary | ICD-10-CM | POA: Diagnosis present

## 2023-04-08 DIAGNOSIS — Z6841 Body Mass Index (BMI) 40.0 and over, adult: Secondary | ICD-10-CM

## 2023-04-08 NOTE — Progress Notes (Signed)
 GYNECOLOGY  VISIT  HPI: 53 y.o. [id number] Single Black or African American female here for problem gyn visit. She called yesterday and was scheduled for an appointment today. Pt states this is the "second week" that she has experienced intermittent pain "under my belly".   She reports her last menstrual period was 2 years ago. Has occasional hot flashes and night sweats. Five days ago she noted some "dark red" vaginal spotting "clumpy". She noticed this for 2 days. She was diagnosed with uterine fibroids in the past.    Past Medical History:  Diagnosis Date   Allergic rhinitis    Anxiety    B12 deficiency    DDD (degenerative disc disease), lumbar    Depression    Hypertension    Joint pain    Low back pain    Migraines    Morbid obesity (HCC)    Vitamin D deficiency     MEDS:   Current Outpatient Medications on File Prior to Visit  Medication Sig Dispense Refill   buPROPion (WELLBUTRIN) 100 MG tablet Take 100 mg by mouth 2 (two) times daily.     Cholecalciferol 1.25 MG (50000 UT) TABS Take 1 weekly 4 tablet 0   cyclobenzaprine (FLEXERIL) 10 MG tablet Take 1 tablet (10 mg total) by mouth 3 (three) times daily as needed for muscle spasms. 45 tablet 0   FLUoxetine (PROZAC) 20 MG capsule Take 20 mg by mouth daily.     hydrochlorothiazide (HYDRODIURIL) 25 MG tablet Take 25 tablets by mouth daily.     ibuprofen (ADVIL) 600 MG tablet Take 1 tablet (600 mg total) by mouth every 6 (six) hours as needed. 30 tablet 0   lidocaine (LIDODERM) 5 % Place 1 patch onto the skin daily. Remove & Discard patch within 12 hours or as directed by MD 30 patch 0   naproxen (NAPROSYN) 500 MG tablet Take 1 tablet (500 mg total) by mouth 2 (two) times daily. 60 tablet 0   nystatin cream (MYCOSTATIN) Apply 1 Application topically 2 (two) times daily. 30 g 0   potassium chloride SA (KLOR-CON M) 20 MEQ tablet Take one tablet twice a day for ten days, then take one tablet every day 40 tablet 0   triamcinolone  cream (KENALOG) 0.1 % Apply 1 application topically daily.  0   No current facility-administered medications on file prior to visit.    ALLERGIES: Escitalopram, Escitalopram oxalate, and Topiramate   Review of Systems  Constitutional:  Negative for chills and fever.  Genitourinary:  Negative for dysuria, flank pain, frequency, hematuria and urgency.  Neurological:  Negative for dizziness and weakness.   PHYSICAL EXAMINATION:    BP (!) 152/78 (BP Location: Right Arm, Patient Position: Sitting, Cuff Size: Large)   Pulse 86   Ht 5\' 9"  (1.753 m)   Wt (!) 376 lb (170.6 kg)   BMI 55.53 kg/m     General appearance: alert, cooperative and appears stated age, BMI 55.5 Abdomen: soft, non-tender; bowel sounds normal; no masses,  no organomegaly   Pelvic: External genitalia:  no lesions              Urethra:  normal appearing urethra with no masses, tenderness or lesions              Bartholins and Skenes: normal                 Vagina: normal rugae  Cervix: multiparous appearance, no cervical motion tenderness, no lesions, and scant bright red bleeding after pap smear              Bimanual Exam:  Uterus:   unable to palpate uterus due to body habitus              Adnexa:  unable to palpate uterus or ovaries due to body habitus             Anus:  normal sphincter tone, no lesions  Chaperone, Hendricks Milo, CMA, was present for exam.  Assessment/Plan: 1. Cervical cancer screening (Primary) - Pt overdue for pap smear and agreeable to routine pap smear screening today - Cytology - PAP( [place])  2. Routine screening for STI (sexually transmitted infection) - Cytology - PAP( [place])  3. Postmenopausal bleeding - Discussed availability [place] and endometrial biopsy to rule out endometrial abnormalities. Pt gave verbal and written consent for Endometrial biopsy.  - Surgical pathology( [facility]/ [facility]) - US PELVIC COMPLETE WITH TRANSVAGINAL; Future  4. Pelvic  pain - UA Negative - Urine Culture - Hx BTL  ENDOMETRIAL BIOPSY     The indications for endometrial biopsy were reviewed.   Risks of the biopsy including cramping, bleeding, infection, uterine perforation, inadequate specimen and need for additional procedures  were discussed. The patient states she understands and agrees to undergo procedure today. Consent was signed. Time out was performed.  A sterile speculum was placed in the patient's vagina and the cervix was prepped with Betadine. A single-toothed tenaculum was placed on the anterior lip of the cervix to stabilize it. The 3 mm pipelle was introduced into the endometrial cavity without difficulty to a depth of 9 cm, and a moderate amount of tissue was obtained and sent to pathology. The instruments were removed from the patient's vagina. Minimal bleeding from the cervix was noted. The patient tolerated the procedure well. Routine post-procedure instructions were given to the patient. The patient will follow up to review the results and for further management.     Pt scheduled for Pelvic US at 11am today.   Letta Kocher

## 2023-04-09 ENCOUNTER — Encounter (HOSPITAL_BASED_OUTPATIENT_CLINIC_OR_DEPARTMENT_OTHER): Payer: Self-pay | Admitting: Radiology

## 2023-04-09 ENCOUNTER — Ambulatory Visit (HOSPITAL_BASED_OUTPATIENT_CLINIC_OR_DEPARTMENT_OTHER)
Admission: RE | Admit: 2023-04-09 | Discharge: 2023-04-09 | Disposition: A | Discharge status: Home / Self Care | Source: Ambulatory Visit | Attending: Certified Nurse Midwife | Admitting: Certified Nurse Midwife

## 2023-04-09 DIAGNOSIS — N950 Postmenopausal bleeding: Secondary | ICD-10-CM | POA: Diagnosis present

## 2023-04-10 LAB — URINE CULTURE

## 2023-04-11 ENCOUNTER — Encounter (HOSPITAL_BASED_OUTPATIENT_CLINIC_OR_DEPARTMENT_OTHER): Payer: Self-pay | Admitting: Certified Nurse Midwife

## 2023-04-11 LAB — SURGICAL PATHOLOGY

## 2023-04-12 ENCOUNTER — Encounter (HOSPITAL_BASED_OUTPATIENT_CLINIC_OR_DEPARTMENT_OTHER): Payer: Self-pay | Admitting: Certified Nurse Midwife

## 2023-04-12 LAB — CYTOLOGY - PAP
Chlamydia: NEGATIVE
Comment: NEGATIVE
Comment: NEGATIVE
Comment: NEGATIVE
Comment: NORMAL
Diagnosis: UNDETERMINED — AB
High risk HPV: NEGATIVE
Neisseria Gonorrhea: NEGATIVE
Trichomonas: NEGATIVE

## 2023-04-13 ENCOUNTER — Encounter (HOSPITAL_BASED_OUTPATIENT_CLINIC_OR_DEPARTMENT_OTHER): Payer: Self-pay | Admitting: Certified Nurse Midwife

## 2023-04-13 ENCOUNTER — Ambulatory Visit (HOSPITAL_BASED_OUTPATIENT_CLINIC_OR_DEPARTMENT_OTHER): Admitting: Certified Nurse Midwife

## 2023-07-03 ENCOUNTER — Other Ambulatory Visit: Payer: Self-pay | Admitting: Nurse Practitioner

## 2023-07-03 DIAGNOSIS — B372 Candidiasis of skin and nail: Secondary | ICD-10-CM
